# Patient Record
Sex: Female | Born: 1935 | Race: White | Hispanic: No | Smoking: Never smoker
Health system: Southern US, Community
[De-identification: ages and names within clinical notes are randomized; demographics above are authoritative.]

## PROBLEM LIST (undated history)

## (undated) DIAGNOSIS — C801 Malignant (primary) neoplasm, unspecified: Secondary | ICD-10-CM

## (undated) DIAGNOSIS — N183 Chronic kidney disease, stage 3 unspecified: Secondary | ICD-10-CM

## (undated) DIAGNOSIS — F0280 Dementia in other diseases classified elsewhere without behavioral disturbance: Secondary | ICD-10-CM

## (undated) DIAGNOSIS — I609 Nontraumatic subarachnoid hemorrhage, unspecified: Secondary | ICD-10-CM

## (undated) DIAGNOSIS — I10 Essential (primary) hypertension: Secondary | ICD-10-CM

## (undated) DIAGNOSIS — E785 Hyperlipidemia, unspecified: Secondary | ICD-10-CM

## (undated) DIAGNOSIS — T148XXA Other injury of unspecified body region, initial encounter: Secondary | ICD-10-CM

## (undated) DIAGNOSIS — G309 Alzheimer's disease, unspecified: Secondary | ICD-10-CM

## (undated) DIAGNOSIS — E119 Type 2 diabetes mellitus without complications: Secondary | ICD-10-CM

## (undated) DIAGNOSIS — S066X9A Traumatic subarachnoid hemorrhage with loss of consciousness of unspecified duration, initial encounter: Secondary | ICD-10-CM

## (undated) DIAGNOSIS — R4020 Unspecified coma: Secondary | ICD-10-CM

## (undated) HISTORY — PX: ABDOMINAL HYSTERECTOMY: SHX81

## (undated) HISTORY — PX: MASTECTOMY PARTIAL / LUMPECTOMY: SUR851

## (undated) HISTORY — PX: EYE SURGERY: SHX253

---

## 2004-03-03 ENCOUNTER — Ambulatory Visit: Payer: Self-pay | Admitting: Radiation Oncology

## 2004-06-29 ENCOUNTER — Ambulatory Visit: Payer: Self-pay | Admitting: Internal Medicine

## 2004-09-19 ENCOUNTER — Ambulatory Visit: Payer: Self-pay | Admitting: Internal Medicine

## 2004-12-26 ENCOUNTER — Ambulatory Visit: Payer: Self-pay | Admitting: Internal Medicine

## 2004-12-27 ENCOUNTER — Ambulatory Visit: Payer: Self-pay | Admitting: Internal Medicine

## 2005-03-02 ENCOUNTER — Ambulatory Visit: Payer: Self-pay | Admitting: Radiation Oncology

## 2005-06-26 ENCOUNTER — Ambulatory Visit: Payer: Self-pay | Admitting: Internal Medicine

## 2005-06-29 ENCOUNTER — Ambulatory Visit: Payer: Self-pay | Admitting: Internal Medicine

## 2005-09-25 ENCOUNTER — Ambulatory Visit: Payer: Self-pay | Admitting: Internal Medicine

## 2006-03-01 ENCOUNTER — Ambulatory Visit: Payer: Self-pay | Admitting: Radiation Oncology

## 2006-05-22 ENCOUNTER — Inpatient Hospital Stay: Payer: Self-pay | Admitting: Internal Medicine

## 2006-07-05 ENCOUNTER — Ambulatory Visit: Payer: Self-pay | Admitting: Internal Medicine

## 2006-07-28 ENCOUNTER — Ambulatory Visit: Payer: Self-pay | Admitting: Internal Medicine

## 2006-07-31 IMAGING — CR DG CHEST 2V
1 series · 2 of 2 positions shown · non-contrast
Comparison: none

REASON FOR EXAM: UX CA BREAST
COMMENTS:

[Series 3518: postero_anterior · 0.11mm/px · 2 of 2 slices shown]
[im 1/2]
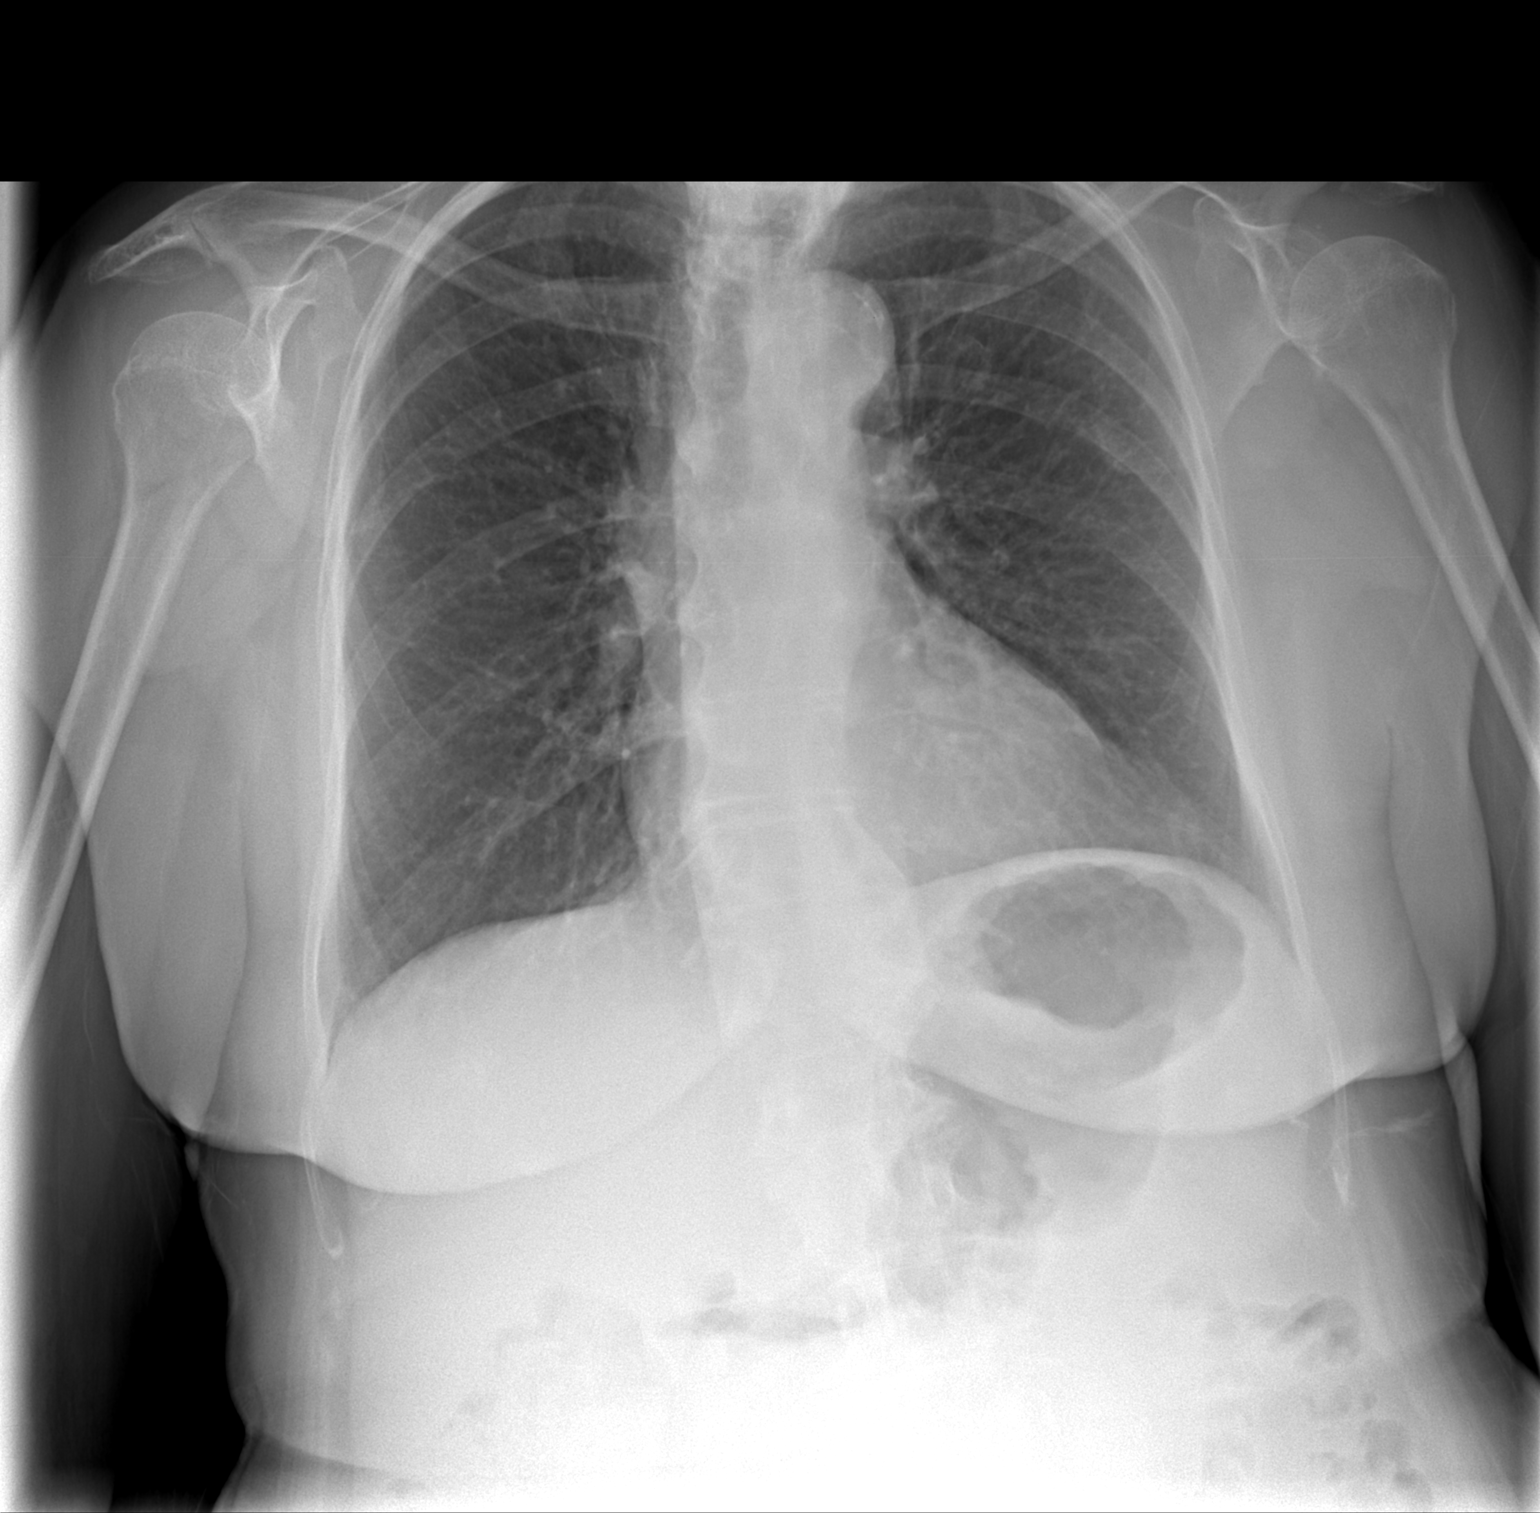
[im 2/2]
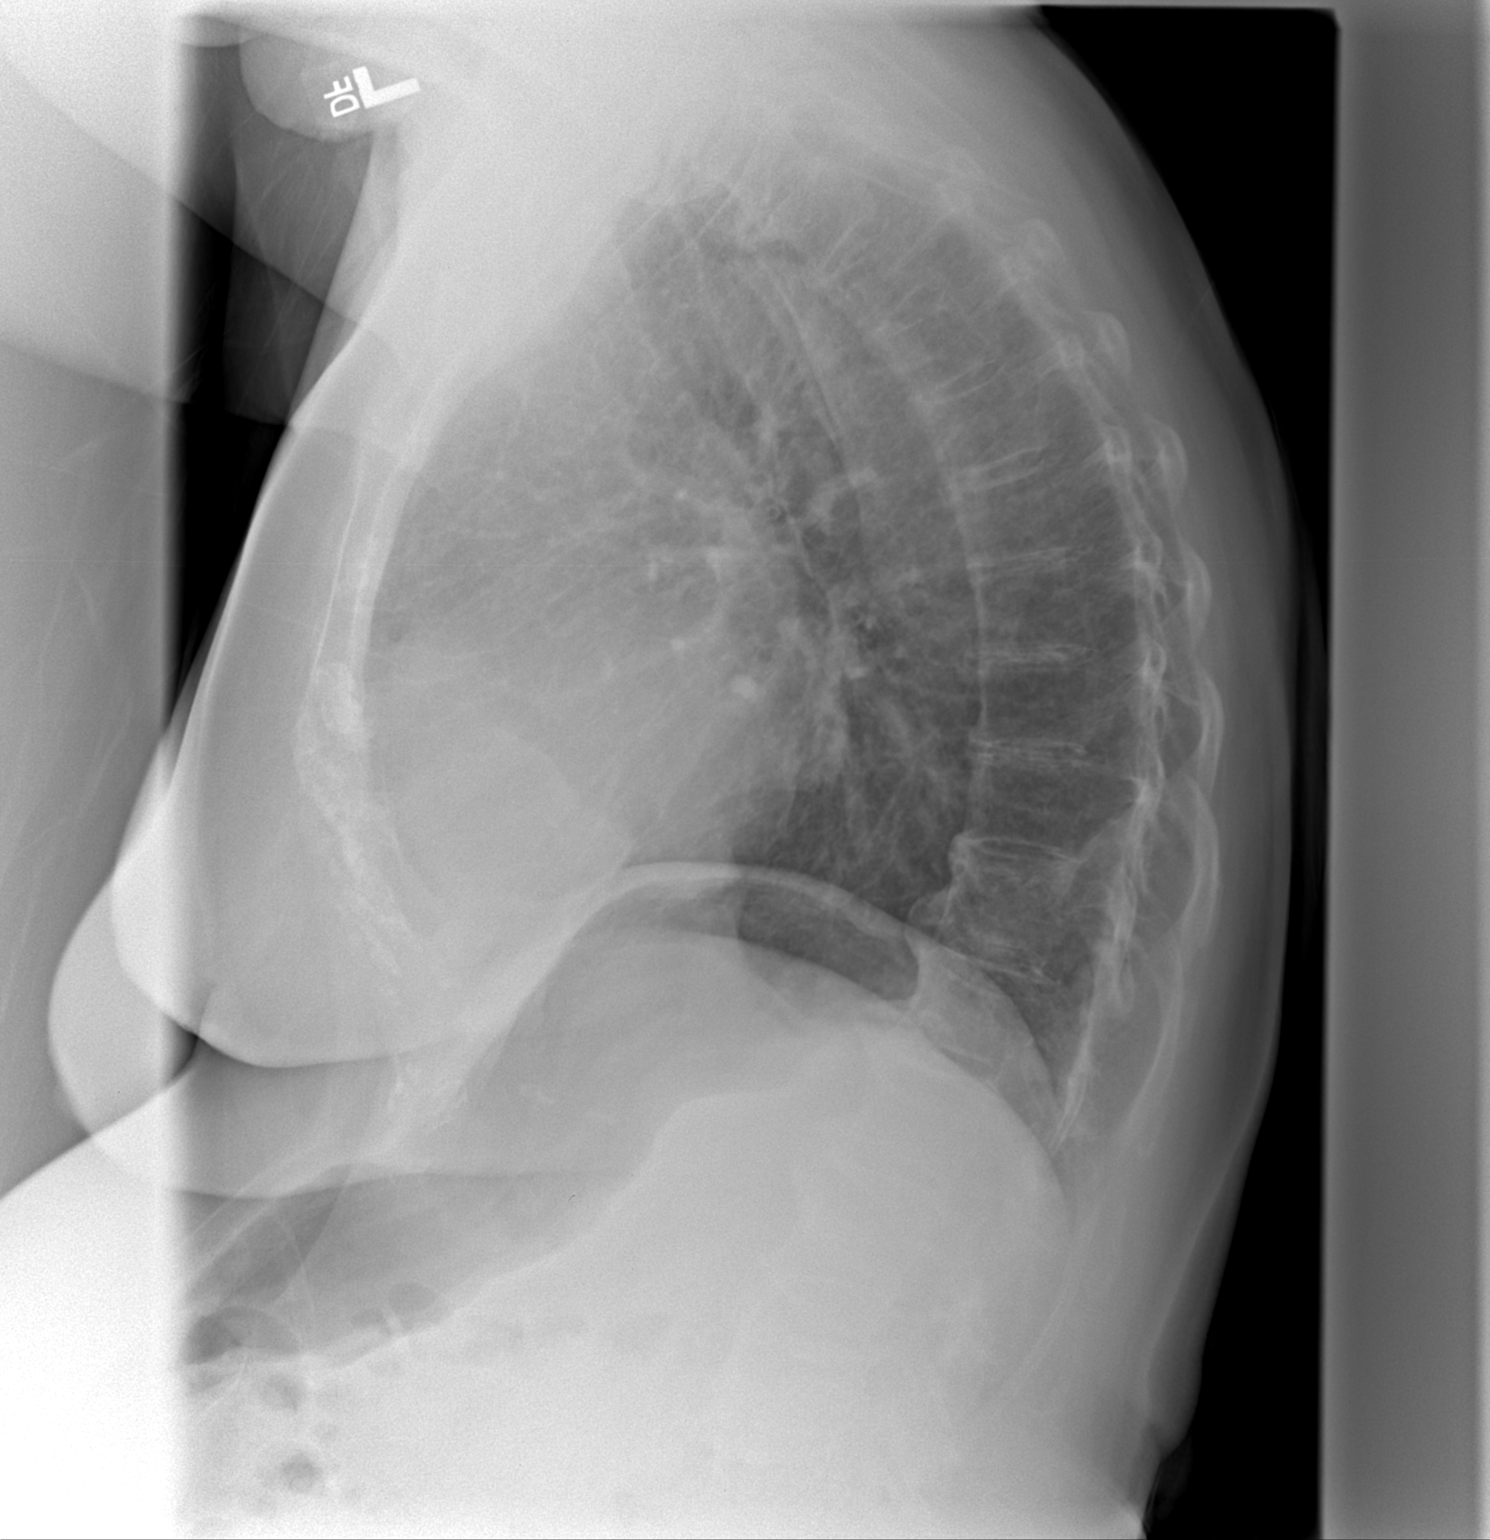

[2 of 2 positions shown; findings below may reference images not displayed]

PROCEDURE:     DXR - DXR CHEST PA (OR AP) AND LATERAL  - June 26, 2005  [DATE]

RESULT:       Comparison is made to study 26 December, 2004.

The lungs are adequately inflated.  There is no focal infiltrate. The heart
is not enlarged.  There is gentle curvature of the thoracolumbar spine.  I
see no compression fracture.  Calcification of the anterior longitudinal
ligament is seen.  There is tortuosity of the descending thoracic aorta.
IMPRESSION: 1.     I see no evidence of congestive heart failure nor of pneumonia.
2.     I do not see findings suspicious for metastatic disease in the chest.

## 2006-10-01 ENCOUNTER — Ambulatory Visit: Payer: Self-pay | Admitting: Internal Medicine

## 2006-12-28 ENCOUNTER — Ambulatory Visit: Payer: Self-pay | Admitting: Internal Medicine

## 2007-01-03 ENCOUNTER — Ambulatory Visit: Payer: Self-pay | Admitting: Internal Medicine

## 2007-01-28 ENCOUNTER — Ambulatory Visit: Payer: Self-pay | Admitting: Internal Medicine

## 2007-07-11 ENCOUNTER — Ambulatory Visit: Payer: Self-pay | Admitting: Internal Medicine

## 2007-07-28 ENCOUNTER — Ambulatory Visit: Payer: Self-pay | Admitting: Internal Medicine

## 2007-08-28 ENCOUNTER — Ambulatory Visit: Payer: Self-pay | Admitting: Internal Medicine

## 2007-09-05 ENCOUNTER — Ambulatory Visit: Payer: Self-pay | Admitting: Internal Medicine

## 2007-09-27 ENCOUNTER — Ambulatory Visit: Payer: Self-pay | Admitting: Internal Medicine

## 2007-10-02 ENCOUNTER — Ambulatory Visit: Payer: Self-pay | Admitting: Internal Medicine

## 2008-02-09 ENCOUNTER — Emergency Department: Payer: Self-pay | Admitting: Emergency Medicine

## 2008-02-27 ENCOUNTER — Ambulatory Visit: Payer: Self-pay | Admitting: Internal Medicine

## 2008-03-03 ENCOUNTER — Ambulatory Visit: Payer: Self-pay | Admitting: Internal Medicine

## 2008-03-29 ENCOUNTER — Ambulatory Visit: Payer: Self-pay | Admitting: Internal Medicine

## 2008-05-21 ENCOUNTER — Emergency Department: Payer: Self-pay | Admitting: Emergency Medicine

## 2008-08-27 ENCOUNTER — Ambulatory Visit: Payer: Self-pay | Admitting: Internal Medicine

## 2008-09-01 ENCOUNTER — Ambulatory Visit: Payer: Self-pay | Admitting: Internal Medicine

## 2008-09-26 ENCOUNTER — Ambulatory Visit: Payer: Self-pay | Admitting: Internal Medicine

## 2008-10-27 ENCOUNTER — Ambulatory Visit: Payer: Self-pay | Admitting: Internal Medicine

## 2009-02-26 ENCOUNTER — Ambulatory Visit: Payer: Self-pay | Admitting: Internal Medicine

## 2009-03-02 ENCOUNTER — Ambulatory Visit: Payer: Self-pay | Admitting: Internal Medicine

## 2009-03-29 ENCOUNTER — Ambulatory Visit: Payer: Self-pay | Admitting: Internal Medicine

## 2009-10-14 ENCOUNTER — Ambulatory Visit: Payer: Self-pay | Admitting: Internal Medicine

## 2010-12-01 ENCOUNTER — Ambulatory Visit: Payer: Self-pay | Admitting: Internal Medicine

## 2011-12-25 ENCOUNTER — Ambulatory Visit: Payer: Self-pay | Admitting: Internal Medicine

## 2013-05-25 ENCOUNTER — Inpatient Hospital Stay: Payer: Self-pay | Admitting: Internal Medicine

## 2013-05-25 LAB — COMPREHENSIVE METABOLIC PANEL
Alkaline Phosphatase: 93 U/L
Anion Gap: 11 (ref 7–16)
BUN: 23 mg/dL — ABNORMAL HIGH (ref 7–18)
Bilirubin,Total: 0.3 mg/dL (ref 0.2–1.0)
Calcium, Total: 10.1 mg/dL (ref 8.5–10.1)
Chloride: 99 mmol/L (ref 98–107)
Co2: 24 mmol/L (ref 21–32)
Creatinine: 1.35 mg/dL — ABNORMAL HIGH (ref 0.60–1.30)
Potassium: 3.8 mmol/L (ref 3.5–5.1)
Sodium: 134 mmol/L — ABNORMAL LOW (ref 136–145)

## 2013-05-25 LAB — URINALYSIS, COMPLETE
Bilirubin,UR: NEGATIVE
Glucose,UR: 50 mg/dL (ref 0–75)
Ketone: NEGATIVE
Nitrite: NEGATIVE
Ph: 5 (ref 4.5–8.0)
Protein: 30
Squamous Epithelial: 1
WBC UR: 253 /HPF (ref 0–5)

## 2013-05-25 LAB — CBC
HCT: 42.7 % (ref 35.0–47.0)
HGB: 14.3 g/dL (ref 12.0–16.0)
MCH: 30.7 pg (ref 26.0–34.0)
MCV: 92 fL (ref 80–100)
RBC: 4.65 10*6/uL (ref 3.80–5.20)
RDW: 14.5 % (ref 11.5–14.5)

## 2013-05-26 LAB — BASIC METABOLIC PANEL
Anion Gap: 5 — ABNORMAL LOW (ref 7–16)
BUN: 25 mg/dL — ABNORMAL HIGH (ref 7–18)
Calcium, Total: 9.3 mg/dL (ref 8.5–10.1)
Chloride: 104 mmol/L (ref 98–107)
Creatinine: 1.45 mg/dL — ABNORMAL HIGH (ref 0.60–1.30)
EGFR (African American): 36 — ABNORMAL LOW
Osmolality: 274 (ref 275–301)
Sodium: 135 mmol/L — ABNORMAL LOW (ref 136–145)

## 2013-05-26 LAB — CBC WITH DIFFERENTIAL/PLATELET
Basophil %: 0.9 %
Eosinophil #: 0 10*3/uL (ref 0.0–0.7)
Eosinophil %: 0.1 %
HCT: 37 % (ref 35.0–47.0)
HGB: 12.6 g/dL (ref 12.0–16.0)
Lymphocyte #: 0.6 10*3/uL — ABNORMAL LOW (ref 1.0–3.6)
Lymphocyte %: 9.6 %
MCH: 31.3 pg (ref 26.0–34.0)
Monocyte #: 0.6 x10 3/mm (ref 0.2–0.9)
Monocyte %: 9.2 %
Neutrophil #: 4.8 10*3/uL (ref 1.4–6.5)
Neutrophil %: 80.2 %
Platelet: 170 10*3/uL (ref 150–440)
RBC: 4.03 10*6/uL (ref 3.80–5.20)
RDW: 14.5 % (ref 11.5–14.5)

## 2013-05-26 LAB — MAGNESIUM: Magnesium: 1.4 mg/dL — ABNORMAL LOW

## 2013-05-27 LAB — CBC WITH DIFFERENTIAL/PLATELET
Basophil #: 0 10*3/uL (ref 0.0–0.1)
Basophil %: 0.9 %
Eosinophil #: 0 10*3/uL (ref 0.0–0.7)
Eosinophil %: 0.8 %
HGB: 12.9 g/dL (ref 12.0–16.0)
Lymphocyte %: 24.4 %
MCH: 30.7 pg (ref 26.0–34.0)
MCHC: 33.4 g/dL (ref 32.0–36.0)
MCV: 92 fL (ref 80–100)
Neutrophil %: 64.2 %
Platelet: 146 10*3/uL — ABNORMAL LOW (ref 150–440)
RBC: 4.19 10*6/uL (ref 3.80–5.20)
RDW: 14.3 % (ref 11.5–14.5)
WBC: 4 10*3/uL (ref 3.6–11.0)

## 2013-05-27 LAB — BASIC METABOLIC PANEL
Anion Gap: 9 (ref 7–16)
BUN: 20 mg/dL — ABNORMAL HIGH (ref 7–18)
Calcium, Total: 8.2 mg/dL — ABNORMAL LOW (ref 8.5–10.1)
Chloride: 108 mmol/L — ABNORMAL HIGH (ref 98–107)
Co2: 22 mmol/L (ref 21–32)
EGFR (Non-African Amer.): 41 — ABNORMAL LOW
Glucose: 148 mg/dL — ABNORMAL HIGH (ref 65–99)
Osmolality: 283 (ref 275–301)
Potassium: 4 mmol/L (ref 3.5–5.1)
Sodium: 139 mmol/L (ref 136–145)

## 2013-05-27 LAB — URINE CULTURE

## 2013-05-28 LAB — CBC WITH DIFFERENTIAL/PLATELET
Basophil %: 0.6 %
Eosinophil #: 0.1 10*3/uL (ref 0.0–0.7)
HCT: 41.3 % (ref 35.0–47.0)
Lymphocyte #: 1.3 10*3/uL (ref 1.0–3.6)
Lymphocyte %: 34.4 %
MCHC: 33.5 g/dL (ref 32.0–36.0)
MCV: 93 fL (ref 80–100)
Monocyte #: 0.3 x10 3/mm (ref 0.2–0.9)
Monocyte %: 9 %
Neutrophil #: 2 10*3/uL (ref 1.4–6.5)
RDW: 14.4 % (ref 11.5–14.5)
WBC: 3.7 10*3/uL (ref 3.6–11.0)

## 2013-05-28 LAB — BASIC METABOLIC PANEL
Anion Gap: 8 (ref 7–16)
BUN: 19 mg/dL — ABNORMAL HIGH (ref 7–18)
Co2: 23 mmol/L (ref 21–32)
Creatinine: 1.14 mg/dL (ref 0.60–1.30)
Glucose: 165 mg/dL — ABNORMAL HIGH (ref 65–99)
Osmolality: 283 (ref 275–301)
Potassium: 4.1 mmol/L (ref 3.5–5.1)
Sodium: 139 mmol/L (ref 136–145)

## 2013-05-30 LAB — CULTURE, BLOOD (SINGLE)

## 2013-05-30 LAB — BASIC METABOLIC PANEL
Anion Gap: 8 (ref 7–16)
BUN: 22 mg/dL — AB (ref 7–18)
CHLORIDE: 106 mmol/L (ref 98–107)
Calcium, Total: 8.8 mg/dL (ref 8.5–10.1)
Co2: 22 mmol/L (ref 21–32)
Creatinine: 1.03 mg/dL (ref 0.60–1.30)
EGFR (African American): 54 — ABNORMAL LOW
GFR CALC NON AF AMER: 46 — AB
Glucose: 139 mg/dL — ABNORMAL HIGH (ref 65–99)
Osmolality: 278 (ref 275–301)
Potassium: 3.9 mmol/L (ref 3.5–5.1)
SODIUM: 136 mmol/L (ref 136–145)

## 2013-05-30 LAB — MAGNESIUM: Magnesium: 2.1 mg/dL

## 2013-08-24 ENCOUNTER — Inpatient Hospital Stay: Payer: Self-pay | Admitting: Internal Medicine

## 2013-08-24 LAB — URINALYSIS, COMPLETE
Bilirubin,UR: NEGATIVE
Blood: NEGATIVE
Glucose,UR: >=500 mg/dL (ref 0–75)
Hyaline Cast: 2
Ketone: NEGATIVE
Nitrite: NEGATIVE
PH: 6 (ref 4.5–8.0)
Protein: NEGATIVE
RBC,UR: 1 /HPF (ref 0–5)
Specific Gravity: 1.01 (ref 1.003–1.030)

## 2013-08-24 LAB — PROTIME-INR
INR: 1
Prothrombin Time: 13.2 secs (ref 11.5–14.7)

## 2013-08-24 LAB — COMPREHENSIVE METABOLIC PANEL
ALBUMIN: 3.2 g/dL — AB (ref 3.4–5.0)
ALK PHOS: 93 U/L
AST: 26 U/L (ref 15–37)
Anion Gap: 6 — ABNORMAL LOW (ref 7–16)
BUN: 24 mg/dL — AB (ref 7–18)
Bilirubin,Total: 0.3 mg/dL (ref 0.2–1.0)
CO2: 28 mmol/L (ref 21–32)
Calcium, Total: 9.3 mg/dL (ref 8.5–10.1)
Chloride: 98 mmol/L (ref 98–107)
Creatinine: 1.45 mg/dL — ABNORMAL HIGH (ref 0.60–1.30)
EGFR (Non-African Amer.): 31 — ABNORMAL LOW
GFR CALC AF AMER: 36 — AB
GLUCOSE: 303 mg/dL — AB (ref 65–99)
Osmolality: 280 (ref 275–301)
Potassium: 4.2 mmol/L (ref 3.5–5.1)
SGPT (ALT): 23 U/L (ref 12–78)
SODIUM: 132 mmol/L — AB (ref 136–145)
Total Protein: 7.1 g/dL (ref 6.4–8.2)

## 2013-08-24 LAB — CBC WITH DIFFERENTIAL/PLATELET
Basophil #: 0.1 10*3/uL (ref 0.0–0.1)
Basophil %: 0.7 %
EOS ABS: 0 10*3/uL (ref 0.0–0.7)
Eosinophil %: 0.1 %
HCT: 40.9 % (ref 35.0–47.0)
HGB: 13.2 g/dL (ref 12.0–16.0)
Lymphocyte #: 0.9 10*3/uL — ABNORMAL LOW (ref 1.0–3.6)
Lymphocyte %: 6.7 %
MCH: 30.2 pg (ref 26.0–34.0)
MCHC: 32.3 g/dL (ref 32.0–36.0)
MCV: 94 fL (ref 80–100)
Monocyte #: 0.5 x10 3/mm (ref 0.2–0.9)
Monocyte %: 3.8 %
NEUTROS ABS: 12 10*3/uL — AB (ref 1.4–6.5)
Neutrophil %: 88.7 %
Platelet: 213 10*3/uL (ref 150–440)
RBC: 4.38 10*6/uL (ref 3.80–5.20)
RDW: 14.5 % (ref 11.5–14.5)
WBC: 13.6 10*3/uL — ABNORMAL HIGH (ref 3.6–11.0)

## 2013-08-25 LAB — BASIC METABOLIC PANEL
ANION GAP: 7 (ref 7–16)
BUN: 21 mg/dL — ABNORMAL HIGH (ref 7–18)
CO2: 24 mmol/L (ref 21–32)
Calcium, Total: 8.4 mg/dL — ABNORMAL LOW (ref 8.5–10.1)
Chloride: 106 mmol/L (ref 98–107)
Creatinine: 1.38 mg/dL — ABNORMAL HIGH (ref 0.60–1.30)
EGFR (African American): 38 — ABNORMAL LOW
EGFR (Non-African Amer.): 33 — ABNORMAL LOW
Glucose: 118 mg/dL — ABNORMAL HIGH (ref 65–99)
OSMOLALITY: 278 (ref 275–301)
POTASSIUM: 4 mmol/L (ref 3.5–5.1)
SODIUM: 137 mmol/L (ref 136–145)

## 2013-08-25 LAB — CBC WITH DIFFERENTIAL/PLATELET
Basophil #: 0.1 10*3/uL (ref 0.0–0.1)
Basophil %: 0.7 %
EOS ABS: 0.2 10*3/uL (ref 0.0–0.7)
Eosinophil %: 1.6 %
HCT: 35 % (ref 35.0–47.0)
HGB: 11.8 g/dL — AB (ref 12.0–16.0)
LYMPHS PCT: 25.2 %
Lymphocyte #: 2.5 10*3/uL (ref 1.0–3.6)
MCH: 31.4 pg (ref 26.0–34.0)
MCHC: 33.8 g/dL (ref 32.0–36.0)
MCV: 93 fL (ref 80–100)
Monocyte #: 0.8 x10 3/mm (ref 0.2–0.9)
Monocyte %: 8 %
Neutrophil #: 6.5 10*3/uL (ref 1.4–6.5)
Neutrophil %: 64.5 %
PLATELETS: 212 10*3/uL (ref 150–440)
RBC: 3.77 10*6/uL — ABNORMAL LOW (ref 3.80–5.20)
RDW: 14.6 % — ABNORMAL HIGH (ref 11.5–14.5)
WBC: 10.1 10*3/uL (ref 3.6–11.0)

## 2013-08-26 LAB — URINE CULTURE

## 2014-09-18 NOTE — H&P (Signed)
PATIENT NAME:  Cynthia Lynch, Cynthia Lynch MR#:  [id number] DATE OF BIRTH:  1916-10-02  DATE OF ADMISSION:  10/26/2012  PRIMARY CARE PHYSICIAN:  Rusty Aus, MD  REFERRING PHYSICIAN:  Briant Sites. Joni Fears, MD  CHIEF COMPLAINT:  Weakness for 2 days.   HISTORY OF PRESENT ILLNESS: A 81 year old Caucasian female with a history of hypertension, diabetes, dementia, was sent to ED due to weakness and confusion for the past 2 days. The patient is disoriented, unable to provide any information. According to the patient's daughters, the patient was fine until 2 days ago. She was noticed to have generalized weakness, unable to stand or walk. In addition, the patient has decreased mental status, disorientated, so the patient was sent to the ED for further evaluation. The patient was noted to have a UTI and also the patient has a poor oral intake for the past 2 days.   PAST MEDICAL HISTORY: Hypertension, diabetes, dementia, breast cancer, ovary cancer.   PAST SURGICAL HISTORY: Right-sided lumpectomy, hysterectomy, bilateral oophorectomy, bilateral cataract surgery.   SOCIAL HISTORY: No smoking, alcohol drinking or illicit drugs.   FAMILY HISTORY: Positive for diabetes.   REVIEW OF SYSTEMS: Unable to obtain due to patient's mental status.   ALLERGIES: BEE STING.   HOME MEDICATIONS:  1.  Tylenol 325 mg p.o. p.r.n.  2.  Sudafed PE 10 mg p.o. once a day.  3.  Premarin vaginal cream 0.625 mg topical daily.  4. HCTZ-lisinopril 12.5-10 mg p.o. daily.  5.  Glimepiride 1 mg p.o. 0.5 tablets once a day.  6.  Citracal plus D 630 mg p.o. b.i.d.  7.  Benadryl 25 mg p.o. 1 tablet p.r.n.  8.  Aspirin 81 mg p.o. daily.  9.  Arimidex tablets 1 tablet p.o. daily.  10.  Alendronate 70 mg p.o. once a week.   PHYSICAL EXAMINATION: VITAL SIGNS: Temperature 100.5, blood pressure 124/55, pulse 108, respirations 20, O2  saturation 95% on room air.  GENERAL: The patient is disoriented,  did not follow commands, in no acute  distress.  HEENT: Unable to examine because the patient is not cooperative. NECK: Supple. No JVD or carotid bruits. No lymphadenopathy.  CARDIOVASCULAR: S1, S2 regular rate and rhythm. No murmurs, gallops.  PULMONARY: Bilateral air entry. Bilateral wheezing and crackles. No use of accessory muscle to breathe.  ABDOMEN: Soft. No distention. No tenderness. No organomegaly. Bowel sounds present.  EXTREMITIES: No edema, clubbing or cyanosis. No calf tenderness. Bilateral pedal pulses present.  SKIN: No rash or jaundice.  NEUROLOGY:  Unable to examine due to the patient's mental status.   LABORATORY AND RADIOLOGICAL DATA:   1.  Urinalysis shows WBC 253, RBCs 10 and nitrite negative.  2.  Chest x-ray did not show any edema or consolidation.  3.  Lactic acid 3.9, glucose 238, BUN 23, creatinine 1.35, sodium 134, potassium 3.8, chloride 99, bicarb 24.  4.  CBC in normal range.   IMPRESSIONS: 1.  Acute encephalopathy.  2.  Urinary tract infection.  3.  Questionable pneumonia.  4.  Acute renal failure.  5.  Hypertension.  6.  Diabetes.  7.  Dementia.   PLAN OF TREATMENT: 1.  The patient will be admitted to medical floor. We will start Levaquin. Follow up blood culture, urine culture, CBC.  2.  For acute renal failure, we will start on normal saline. Follow up BMP.  3.  We will get a physical therapy evaluation.  4.  For diabetes, we will start a sliding scale but hold  glyburide.   I discussed the patient's condition and plan of treatment with the patient's 2 daughters.  They cannot decide CODE STATUS now, we will place for full code now.   TIME SPENT: About 53 minutes.    ____________________________ Demetrios Loll, MD qc:cs D: 08/13/2012 19:26:00 ET T: 08/13/2012 20:41:05 ET JOB#: [id number]  cc: Demetrios Loll, MD, <Dictator> Demetrios Loll MD ELECTRONICALLY SIGNED 05/26/2013 17:03

## 2014-09-19 NOTE — Discharge Summary (Signed)
PATIENT NAME:  Cynthia Lynch, Cynthia Lynch MR#:  [id number] DATE OF BIRTH:  11/01/1916  DATE OF ADMISSION:  12-21-2012 DATE OF DISCHARGE:  05/30/2013  DISCHARGE DIAGNOSES: 1.  Encephalopathy due to urinary tract infection.  2.  Asthmatic bronchitis.  3.  Hypokalemia/hypomagnesemia.  4.  Acute renal failure, resolved, secondary to volume depletion.  5.  Hypertension.  6.  Dementia, moderate, Alzheimer type.  7.  Diabetes mellitus, non-insulin-requiring.     DISCHARGE MEDICATIONS:   1.  Lisinopril HCT10/12.5 mg daily.  2.  Citracal 600 mg b.i.d. 3.  Allegra 60 mg b.i.d. 4. Glimepiride 2 mg b.i.d.  5.  Aspirin 81 mg daily.  6.  Premarin cream, apply small amount 3 times weekly to the urethral opening.  7.  Prednisone 10 mg daily x 7 days.  8.  Robitussin p.r.n. cough.  9.  Senna 1 tab daily.  10.  Magnesium oxide 400 mg daily.  11.  Ceftin 250 mg b.i.d. x 7 days.   REASON FOR ADMISSION: A 81 year old female presents with altered mental status, UTI and acute renal failure. Please see H and P for HPI, past medical history and physical exam.   HOSPITAL COURSE: The patient was admitted, treated with IV Rocephin due to resistant E. coli. She was switched over to Ceftin. She did have a flare of her bronchitis with some asthma and received steroids for a couple of days; that is all resolved. Her potassium and magnesium were replaced. She was hydrated and renal function came back to normal. Overall prognosis is guarded.    ____________________________ Rusty Aus, MD mfm:dmm D: 05/30/2013 08:11:56 ET T: 05/30/2013 09:42:54 ET JOB#: [id number]  cc: Rusty Aus, MD, <Dictator> Rusty Aus MD ELECTRONICALLY SIGNED 06/02/2013 8:18

## 2014-09-19 NOTE — Discharge Summary (Signed)
PATIENT NAME:  Cynthia Lynch, Cynthia Lynch MR#:  [id number] DATE OF BIRTH:  Oct 19, 1916  DATE OF ADMISSION:  08/24/2013 DATE OF DISCHARGE:  08/26/2013  DISCHARGE DIAGNOSES: 1. Subarachnoid bleed post head trauma with right frontal scalp hematoma.  2. Alzheimer's dementia.  3. Chronic renal insufficiency.  4. Peripheral vascular disease.  5. Carotid stenosis.  6. Aortic murmur.  7. Diabetes mellitus, non-insulin-requiring.   DISCHARGE MEDICATIONS: Macrobid 100 mg daily, fexofenadine 60 mg b.i.d., glimepiride 2 mg b.i.d.  Premarin vaginal cream daily, Bactroban cream to the area daily, magnesium 400 mg daily, senna 8.6 mg daily. HCTZ 12.5 mg daily, nystatin cream p.r.n., Citracal D b.i.d.   REASON FOR ADMISSION: A 81 year old female presents with a head trauma and subarachnoid bleed. Please see H and P for HPI, past medical history, and physical exam.   HOSPITAL COURSE: The patient was admitted. Brain CT showed a limited frontal subarachnoid bleed that was unchanged on repeat CT. Frontal hematoma on her scalp was stable as well. No sign of cellulitis. Mentally she remained stable. She had really no altered mental status. No worsened confusion and she was treated conservatively. Aspirin was discontinued. Blood pressure medicine was discontinued as her pressure systolic were running around 95 to 100. Overall prognosis is guarded with her multiple medical problems and moderate to severe dementia. She will be going back to [facility] this afternoon.  ____________________________ Rusty Aus, MD mfm:sg D: 08/26/2013 07:34:14 ET T: 08/26/2013 08:00:04 ET JOB#: [id number]  cc: Rusty Aus, MD, <Dictator> [place] MD ELECTRONICALLY SIGNED 08/26/2013 8:17

## 2014-09-19 NOTE — H&P (Signed)
PATIENT NAME:  Cynthia Lynch, Cynthia Lynch MR#:  [id number] DATE OF BIRTH:  Jul 02, 1916  DATE OF ADMISSION:  08/24/2013  REFERRING PHYSICIAN: Dr. Jasmine December.   FAMILY PHYSICIAN: Dr. Emily Filbert.   REASON FOR ADMISSION: Head trauma with subarachnoid hemorrhage.   HISTORY OF PRESENT ILLNESS: The patient is a 81 year old female who resides at [facility] with a significant history of Alzheimer's dementia, benign hypertension, as well as recurrent UTIs with chronic renal failure. The patient also has a history of breast and ovarian cancer. She is chronically confused and demented. She apparently fell out of bed yesterday or last night and was found by the caretakers at the assisted living facility on the floor with a large amount of dried blood to the face, head, arms, and hands. She was brought to the Emergency Room where she was noted to have a large hematoma above the right eye. CT showed subarachnoid hemorrhage. She is a no code and the family does not want her transported to a larger medical center. The ER physician did speak with neurosurgery who said they would not do anything in this case anyway. She is now admitted here for further treatment and evaluation. The patient currently denies pain. She is alert, but disoriented to place or time.   PAST MEDICAL HISTORY: 1. Alzheimer's dementia.  2. History of pyelonephritis with recurrent urinary tract infections.  3. Chronic renal failure.  4. Benign hypertension.  5. Peripheral vascular disease.  6. History of breast cancer status post left lumpectomy.  7. History of ovarian cancer, status post total abdominal hysterectomy with bilateral salpingo-oophorectomy.  8. Hyperlipidemia.  9. Right carotid stenosis.  10. Aortic murmur.  11. Allergic rhinitis.  12. Type 2 diabetes.   MEDICATIONS: 1. Lisinopril hydrochlorothiazide 10/12.5, 1 p.o. daily.  2. Macrobid 100 mg p.o. daily.  3. Fexofenadine 60 mg p.o. b.i.d.  4. Glimepiride 2 mg p.o.  b.i.d.  5. Aspirin 81 mg p.o. daily.  6. Premarin vaginal cream once daily to urethral opening.   ALLERGIES: No known drug allergies.   SOCIAL HISTORY: Negative for alcohol or tobacco abuse.   FAMILY HISTORY: Noncontributory.   REVIEW OF SYSTEMS: Unable to obtain from the patient due to her confusion.   PHYSICAL EXAMINATION: GENERAL: The patient is in no acute distress.  VITAL SIGNS: Currently remarkable for a blood pressure of 124/66, heart rate of 80, respiratory rate 18, temperature of 97.6, sat 100% on room air.  HEENT: Normocephalic with a large hematoma to the right forehead with dried blood and laceration noted. Pupils equally round and reactive to light and accommodation. _____ intact. Sclerae are anicteric. Conjunctivae are clear.  OROPHARYNX: Clear.  NECK: Supple without JVD. No adenopathy or thyromegaly is noted.  LUNGS: Clear to auscultation and percussion without wheezes, rales or rhonchi. No dullness. Respiratory effort is normal.  CARDIAC: Regular rate and rhythm. Normal S1, S2. There is a 2/6 systolic murmur noted at the upper sternal border. No rubs or gallops are present. PMI is nondisplaced. Chest wall is nontender.  ABDOMEN: Soft, nontender, with normoactive bowel sounds. No organomegaly or masses were appreciated. No hernias or bruits were noted.  EXTREMITIES: Reveal trace edema with stasis changes. Pulses were 2+ bilaterally.  SKIN: Warm and dry without rash.  NEUROLOGIC: Cranial nerves II through XII grossly intact. Deep tendon reflexes were symmetric. Motor and sensory examination is nonfocal.  PSYCHIATRIC: Could not be performed.   LABORATORY DATA: EKG revealed sinus rhythm with no acute ischemic changes. Head CT showed a  large right forehead scalp hematoma with a small amount of subarachnoid blood in the frontal regions bilaterally. CT of the cervical spine showed degenerative changes only.   Her white count was 13.6 with a hemoglobin of 13.2. Glucose 303 with a  BUN of 24, creatinine 1.45, sodium of 132 and a GFR of 31.   ASSESSMENT: 1. Subarachnoid hemorrhage due to trauma.  2. Right forehead hematoma.  3. Stage III chronic kidney disease.  4. Type 2 diabetes.  5. Benign hypertension.  6. Alzheimer dementia.   PLAN: The patient will be admitted to the floor as a NO CODE BLUE, DO NOT RESUSCITATE. Neuro checks and vital signs q.4h. We will send off a urine culture. We will follow her sugars closely. We will hold her diuretic and begin IV fluids. We will start IV antibiotics. Follow-up head CT in 24 hours. We will consult the social worker and physical therapy. Soft diet for now. Further treatment and evaluation will depend upon the patient's progress.   TOTAL TIME SPENT THIS PATIENT: 50 minutes.    ____________________________ Leonie Douglas Doy Hutching, MD jds:sg D: 08/24/2013 09:16:33 ET T: 08/24/2013 10:26:36 ET JOB#: [id number]  cc: Leonie Douglas. Doy Hutching, MD, <Dictator> Rusty Aus, MD  Ariannie Penaloza D Alan Drummer MD ELECTRONICALLY SIGNED 08/25/2013 12:01

## 2014-09-19 NOTE — Discharge Summary (Signed)
PATIENT NAME:  Cynthia Lynch, BUCKELS MR#:  [id number] DATE OF BIRTH:  [birth year]  DATE OF ADMISSION:  08/24/2013 DATE OF DISCHARGE:  08/27/2013   Addendum  The patient held overnight because she could not urinate post Foley catheter removal, but that issue resolved, and she is doing well this morning and will be going back to [facility].   ____________________________ Rusty Aus, MD mfm:lb D: 08/27/2013 07:29:48 ET T: 08/27/2013 07:33:26 ET JOB#: [id number]  cc: Rusty Aus, MD, <Dictator> Darlean Warmoth Roselee Culver MD ELECTRONICALLY SIGNED 08/28/2013 8:08

## 2015-01-23 ENCOUNTER — Encounter: Payer: Self-pay | Admitting: Emergency Medicine

## 2015-01-23 ENCOUNTER — Emergency Department: Payer: Medicare Other

## 2015-01-23 ENCOUNTER — Inpatient Hospital Stay
Admit: 2015-01-23 | Discharge: 2015-01-23 | Disposition: A | Discharge status: Home / Self Care | Payer: Medicare Other | Attending: Internal Medicine | Admitting: Internal Medicine

## 2015-01-23 ENCOUNTER — Observation Stay
Admission: EM | Admit: 2015-01-23 | Discharge: 2015-01-25 | Payer: Medicare Other | Attending: Internal Medicine | Admitting: Internal Medicine

## 2015-01-23 DIAGNOSIS — I340 Nonrheumatic mitral (valve) insufficiency: Secondary | ICD-10-CM | POA: Diagnosis not present

## 2015-01-23 DIAGNOSIS — R918 Other nonspecific abnormal finding of lung field: Secondary | ICD-10-CM | POA: Insufficient documentation

## 2015-01-23 DIAGNOSIS — Z8249 Family history of ischemic heart disease and other diseases of the circulatory system: Secondary | ICD-10-CM | POA: Diagnosis not present

## 2015-01-23 DIAGNOSIS — E1122 Type 2 diabetes mellitus with diabetic chronic kidney disease: Secondary | ICD-10-CM | POA: Insufficient documentation

## 2015-01-23 DIAGNOSIS — Z79899 Other long term (current) drug therapy: Secondary | ICD-10-CM | POA: Insufficient documentation

## 2015-01-23 DIAGNOSIS — Z8543 Personal history of malignant neoplasm of ovary: Secondary | ICD-10-CM | POA: Insufficient documentation

## 2015-01-23 DIAGNOSIS — F0280 Dementia in other diseases classified elsewhere without behavioral disturbance: Secondary | ICD-10-CM | POA: Diagnosis not present

## 2015-01-23 DIAGNOSIS — G309 Alzheimer's disease, unspecified: Secondary | ICD-10-CM | POA: Diagnosis not present

## 2015-01-23 DIAGNOSIS — Z23 Encounter for immunization: Secondary | ICD-10-CM | POA: Insufficient documentation

## 2015-01-23 DIAGNOSIS — Z853 Personal history of malignant neoplasm of breast: Secondary | ICD-10-CM | POA: Diagnosis not present

## 2015-01-23 DIAGNOSIS — R55 Syncope and collapse: Secondary | ICD-10-CM | POA: Diagnosis present

## 2015-01-23 DIAGNOSIS — I472 Ventricular tachycardia: Secondary | ICD-10-CM | POA: Diagnosis present

## 2015-01-23 DIAGNOSIS — Z8679 Personal history of other diseases of the circulatory system: Secondary | ICD-10-CM | POA: Insufficient documentation

## 2015-01-23 DIAGNOSIS — I4721 Torsades de pointes: Secondary | ICD-10-CM

## 2015-01-23 DIAGNOSIS — I129 Hypertensive chronic kidney disease with stage 1 through stage 4 chronic kidney disease, or unspecified chronic kidney disease: Secondary | ICD-10-CM | POA: Insufficient documentation

## 2015-01-23 DIAGNOSIS — I517 Cardiomegaly: Secondary | ICD-10-CM | POA: Insufficient documentation

## 2015-01-23 DIAGNOSIS — Z66 Do not resuscitate: Secondary | ICD-10-CM | POA: Insufficient documentation

## 2015-01-23 DIAGNOSIS — I700 Atherosclerosis of aorta: Secondary | ICD-10-CM | POA: Diagnosis not present

## 2015-01-23 DIAGNOSIS — E782 Mixed hyperlipidemia: Secondary | ICD-10-CM | POA: Insufficient documentation

## 2015-01-23 DIAGNOSIS — Z9012 Acquired absence of left breast and nipple: Secondary | ICD-10-CM | POA: Insufficient documentation

## 2015-01-23 DIAGNOSIS — E860 Dehydration: Secondary | ICD-10-CM | POA: Diagnosis not present

## 2015-01-23 DIAGNOSIS — N183 Chronic kidney disease, stage 3 (moderate): Secondary | ICD-10-CM | POA: Diagnosis not present

## 2015-01-23 DIAGNOSIS — Z9889 Other specified postprocedural states: Secondary | ICD-10-CM | POA: Insufficient documentation

## 2015-01-23 DIAGNOSIS — N390 Urinary tract infection, site not specified: Secondary | ICD-10-CM | POA: Insufficient documentation

## 2015-01-23 HISTORY — DX: Malignant (primary) neoplasm, unspecified: C80.1

## 2015-01-23 HISTORY — DX: Chronic kidney disease, stage 3 (moderate): N18.3

## 2015-01-23 HISTORY — DX: Traumatic subarachnoid hemorrhage with loss of consciousness of unspecified duration, initial encounter: S06.6X9A

## 2015-01-23 HISTORY — DX: Alzheimer's disease, unspecified: G30.9

## 2015-01-23 HISTORY — DX: Type 2 diabetes mellitus without complications: E11.9

## 2015-01-23 HISTORY — DX: Other injury of unspecified body region, initial encounter: T14.8XXA

## 2015-01-23 HISTORY — DX: Dementia in other diseases classified elsewhere without behavioral disturbance: F02.80

## 2015-01-23 HISTORY — DX: Unspecified coma: R40.20

## 2015-01-23 HISTORY — DX: Chronic kidney disease, stage 3 unspecified: N18.30

## 2015-01-23 HISTORY — DX: Nontraumatic subarachnoid hemorrhage, unspecified: I60.9

## 2015-01-23 HISTORY — DX: Essential (primary) hypertension: I10

## 2015-01-23 HISTORY — DX: Hyperlipidemia, unspecified: E78.5

## 2015-01-23 LAB — CBC WITH DIFFERENTIAL/PLATELET
BASOS ABS: 0.1 10*3/uL (ref 0–0.1)
Basophils Relative: 1 %
EOS ABS: 0.1 10*3/uL (ref 0–0.7)
EOS PCT: 1 %
HCT: 40.9 % (ref 35.0–47.0)
Hemoglobin: 13.6 g/dL (ref 12.0–16.0)
Lymphocytes Relative: 17 %
Lymphs Abs: 2.1 10*3/uL (ref 1.0–3.6)
MCH: 30.4 pg (ref 26.0–34.0)
MCHC: 33.1 g/dL (ref 32.0–36.0)
MCV: 91.8 fL (ref 80.0–100.0)
Monocytes Absolute: 0.8 10*3/uL (ref 0.2–0.9)
Monocytes Relative: 6 %
Neutro Abs: 9.4 10*3/uL — ABNORMAL HIGH (ref 1.4–6.5)
Neutrophils Relative %: 75 %
PLATELETS: 171 10*3/uL (ref 150–440)
RBC: 4.46 MIL/uL (ref 3.80–5.20)
RDW: 14.1 % (ref 11.5–14.5)
WBC: 12.5 10*3/uL — AB (ref 3.6–11.0)

## 2015-01-23 LAB — URINALYSIS COMPLETE WITH MICROSCOPIC (ARMC ONLY)
BILIRUBIN URINE: NEGATIVE
Glucose, UA: 500 mg/dL — AB
Ketones, ur: NEGATIVE mg/dL
NITRITE: POSITIVE — AB
PH: 6 (ref 5.0–8.0)
PROTEIN: 100 mg/dL — AB
Specific Gravity, Urine: 1.02 (ref 1.005–1.030)

## 2015-01-23 LAB — COMPREHENSIVE METABOLIC PANEL
ALK PHOS: 61 U/L (ref 38–126)
ALT: 19 U/L (ref 14–54)
AST: 33 U/L (ref 15–41)
Albumin: 3.5 g/dL (ref 3.5–5.0)
Anion gap: 13 (ref 5–15)
BUN: 27 mg/dL — AB (ref 6–20)
CALCIUM: 9.1 mg/dL (ref 8.9–10.3)
CO2: 26 mmol/L (ref 22–32)
CREATININE: 1.04 mg/dL — AB (ref 0.44–1.00)
Chloride: 99 mmol/L — ABNORMAL LOW (ref 101–111)
GFR calc Af Amer: 51 mL/min — ABNORMAL LOW (ref >60)
GFR, EST NON AFRICAN AMERICAN: 44 mL/min — AB (ref >60)
Glucose, Bld: 282 mg/dL — ABNORMAL HIGH (ref 65–99)
Potassium: 3.7 mmol/L (ref 3.5–5.1)
Sodium: 138 mmol/L (ref 135–145)
Total Bilirubin: 0.7 mg/dL (ref 0.3–1.2)
Total Protein: 6.9 g/dL (ref 6.5–8.1)

## 2015-01-23 LAB — LIPASE, BLOOD: Lipase: 45 U/L (ref 22–51)

## 2015-01-23 LAB — GLUCOSE, CAPILLARY
GLUCOSE-CAPILLARY: 199 mg/dL — AB (ref 65–99)
GLUCOSE-CAPILLARY: 172 mg/dL — AB (ref 65–99)
Glucose-Capillary: 240 mg/dL — ABNORMAL HIGH (ref 65–99)

## 2015-01-23 LAB — TROPONIN I

## 2015-01-23 LAB — TSH: TSH: 5.447 u[IU]/mL — AB (ref 0.350–4.500)

## 2015-01-23 LAB — MAGNESIUM: Magnesium: 1.7 mg/dL (ref 1.7–2.4)

## 2015-01-23 MED ORDER — ONDANSETRON HCL 4 MG PO TABS: 4.0000 mg | ORAL_TABLET | Freq: Four times a day (QID) | ORAL | Status: DC | PRN

## 2015-01-23 MED ORDER — SODIUM CHLORIDE 0.9 % IJ SOLN
3.0000 mL | Freq: Two times a day (BID) | INTRAMUSCULAR | Status: DC
  Administered 2015-01-24 – 2015-01-25 (×3): 3 mL via INTRAVENOUS

## 2015-01-23 MED ORDER — GLIMEPIRIDE 2 MG PO TABS
2.0000 mg | ORAL_TABLET | Freq: Two times a day (BID) | ORAL | Status: DC
  Administered 2015-01-23 – 2015-01-25 (×5): 2 mg via ORAL
  Filled 2015-01-23 (×5): qty 1

## 2015-01-23 MED ORDER — SODIUM CHLORIDE 0.9 % IV SOLN
INTRAVENOUS | Status: DC
  Administered 2015-01-23 – 2015-01-25 (×4): via INTRAVENOUS

## 2015-01-23 MED ORDER — ENOXAPARIN SODIUM 40 MG/0.4ML ~~LOC~~ SOLN
40.0000 mg | SUBCUTANEOUS | Status: DC
  Administered 2015-01-23 – 2015-01-25 (×3): 40 mg via SUBCUTANEOUS
  Filled 2015-01-23 (×3): qty 0.4

## 2015-01-23 MED ORDER — CRANBERRY 250 MG PO CAPS: 250.0000 mg | ORAL_CAPSULE | Freq: Two times a day (BID) | ORAL | Status: DC

## 2015-01-23 MED ORDER — NYSTATIN 100000 UNIT/GM EX CREA
1.0000 "application " | TOPICAL_CREAM | Freq: Two times a day (BID) | CUTANEOUS | Status: DC
  Administered 2015-01-23 – 2015-01-25 (×4): 1 via TOPICAL
  Filled 2015-01-23: qty 15

## 2015-01-23 MED ORDER — INSULIN ASPART 100 UNIT/ML ~~LOC~~ SOLN
0.0000 [IU] | Freq: Three times a day (TID) | SUBCUTANEOUS | Status: DC
  Administered 2015-01-23: 3 [IU] via SUBCUTANEOUS
  Administered 2015-01-23 – 2015-01-25 (×4): 2 [IU] via SUBCUTANEOUS
  Administered 2015-01-25: 1 [IU] via SUBCUTANEOUS
  Filled 2015-01-23: qty 2
  Filled 2015-01-23: qty 1
  Filled 2015-01-23 (×3): qty 2
  Filled 2015-01-23: qty 3

## 2015-01-23 MED ORDER — ACETAMINOPHEN 325 MG PO TABS: 650.0000 mg | ORAL_TABLET | Freq: Four times a day (QID) | ORAL | Status: DC | PRN

## 2015-01-23 MED ORDER — SENNOSIDES 8.6 MG PO TABS
1.0000 | ORAL_TABLET | Freq: Every day | ORAL | Status: DC
  Administered 2015-01-24 – 2015-01-25 (×2): 8.6 mg via ORAL
  Filled 2015-01-23 (×3): qty 1

## 2015-01-23 MED ORDER — ESTROGENS, CONJUGATED 0.625 MG/GM VA CREA: 1.0000 | TOPICAL_CREAM | VAGINAL | Status: DC

## 2015-01-23 MED ORDER — CALCIUM CARBONATE-VITAMIN D 500-200 MG-UNIT PO TABS
1.0000 | ORAL_TABLET | Freq: Two times a day (BID) | ORAL | Status: DC
  Administered 2015-01-23 – 2015-01-25 (×4): 1 via ORAL
  Filled 2015-01-23 (×4): qty 1

## 2015-01-23 MED ORDER — INFLUENZA VAC SPLIT QUAD 0.5 ML IM SUSY
0.5000 mL | PREFILLED_SYRINGE | INTRAMUSCULAR | Status: AC
  Administered 2015-01-25: 0.5 mL via INTRAMUSCULAR
  Filled 2015-01-23 (×2): qty 0.5

## 2015-01-23 MED ORDER — CARVEDILOL 6.25 MG PO TABS
6.2500 mg | ORAL_TABLET | Freq: Two times a day (BID) | ORAL | Status: DC
  Administered 2015-01-23 – 2015-01-25 (×4): 6.25 mg via ORAL
  Filled 2015-01-23 (×4): qty 1

## 2015-01-23 MED ORDER — LORATADINE 10 MG PO TABS
10.0000 mg | ORAL_TABLET | Freq: Every day | ORAL | Status: DC
  Administered 2015-01-24 – 2015-01-25 (×2): 10 mg via ORAL
  Filled 2015-01-23 (×2): qty 1

## 2015-01-23 MED ORDER — SODIUM CHLORIDE 0.9 % IV BOLUS (SEPSIS)
1000.0000 mL | Freq: Once | INTRAVENOUS | Status: AC
  Administered 2015-01-23: 1000 mL via INTRAVENOUS

## 2015-01-23 MED ORDER — GUAIFENESIN 100 MG/5ML PO SOLN
200.0000 mg | Freq: Four times a day (QID) | ORAL | Status: DC | PRN
Start: 2015-01-23 — End: 2015-01-25
  Administered 2015-01-24: 200 mg via ORAL
  Filled 2015-01-23 (×2): qty 10

## 2015-01-23 MED ORDER — ACETAMINOPHEN 325 MG PO TABS
650.0000 mg | ORAL_TABLET | Freq: Two times a day (BID) | ORAL | Status: DC
  Administered 2015-01-23 – 2015-01-25 (×4): 650 mg via ORAL
  Filled 2015-01-23 (×4): qty 2

## 2015-01-23 MED ORDER — ONDANSETRON HCL 4 MG/2ML IJ SOLN: 4.0000 mg | Freq: Four times a day (QID) | INTRAMUSCULAR | Status: DC | PRN

## 2015-01-23 MED ORDER — MAGNESIUM SULFATE 2 GM/50ML IV SOLN
2.0000 g | Freq: Once | INTRAVENOUS | Status: AC
  Administered 2015-01-23: 2 g via INTRAVENOUS
  Filled 2015-01-23: qty 50

## 2015-01-23 MED ORDER — ACETAMINOPHEN 650 MG RE SUPP: 650.0000 mg | Freq: Four times a day (QID) | RECTAL | Status: DC | PRN

## 2015-01-23 NOTE — ED Notes (Signed)
Pt arrived via EMS from [facility].  EMS was called out because of loss of consciousness. Staff had originally thought it was related to diabetes, but patient also vomited.  Rhythm strip with EMS showed possible Torsades initially, but repeated 12 Lead with EMS showed NSR. Pt told staff she did not feel well at [facility] [place].  Patient is alert and confused.

## 2015-01-23 NOTE — Progress Notes (Signed)
*  PRELIMINARY RESULTS* Echocardiogram 2D Echocardiogram has been performed.  Cynthia Lynch 01/23/2015, 11:34 AM

## 2015-01-23 NOTE — ED Provider Notes (Signed)
[facility] Emergency Department Provider Note  Time seen: 7:59 AM  I have reviewed the triage vital signs and the nursing notes.   HISTORY  Chief Complaint No chief complaint on file.    HPI Cynthia Lynch is a 81 y.o. female with a past medical history of Alzheimer's, diabetes, presents the emergency department after a syncopal episode and vomiting. According to EMS the patient lives at [facility], this morning she had one episode of vomiting and went unresponsive and EMS was called. Upon EMS arrival they state the patient was awake and alert and able to answer questions. They transported the patient to the emergency department. In route to the hospital the patient began to state that she felt nauseated and did not feel well during this time EMS had a rhythm strip concerning for possible torsades. This rhythm cleared and returned to normal sinus rhythm within several seconds, and the patient stated that she felt much better. Patient denies any chest pain or shortness of breath, but she does have a history significant for Alzheimer's dementia which makes history Limited.     No past medical history on file.  There are no active problems to display for this patient.   No past surgical history on file.  No current outpatient prescriptions on file.  Allergies Review of patient's allergies indicates not on file.  No family history on file.  Social History Social History  Substance Use Topics  . Smoking status: Not on file  . Smokeless tobacco: Not on file  . Alcohol Use: Not on file    Review of Systems Constitutional: Negative for fever. Cardiovascular: Negative for chest pain. Respiratory: Negative for shortness of breath. Gastrointestinal: Negative for abdominal pain Musculoskeletal: Negative for back pain 10-point ROS otherwise negative, although limited by Alzheimer's  dementia.  ____________________________________________   PHYSICAL EXAM:  Constitutional: Alert. Well appearing and in no distress. Calm, pleasant. Eyes: Normal exam ENT   Head: Normocephalic and atraumatic   Mouth/Throat: Mucous membranes are moist. Cardiovascular: Normal rate, regular rhythm around 75 bpm. Respiratory: Normal respiratory effort without tachypnea nor retractions. Breath sounds are clear and equal bilaterally. No wheezes/rales/rhonchi. Gastrointestinal: Soft, mild distention, no abdominal tenderness to palpation. Musculoskeletal: Nontender with normal range of motion in all extremities. No lower extremity tenderness or edema. Neurologic:  Normal speech and language. No gross focal neurologic deficits  Skin:  Skin is warm, dry and intact.  Psychiatric: Mood and affect are normal.   ____________________________________________    EKG  EKG reviewed and interpreted by myself shows normal sinus rhythm at 76 bpm, narrow QRS, normal axis, normal intervals, nonspecific ST changes are present. No ST elevations noted.  ____________________________________________    RADIOLOGY  No acute abnormality on chest x-ray  ____________________________________________   INITIAL IMPRESSION / ASSESSMENT AND PLAN / ED COURSE  Pertinent labs & imaging results that were available during my care of the patient were reviewed by me and considered in my medical decision making (see chart for details).  Patient with syncope, unresponsiveness, and nausea/vomiting. Currently the patient appears very well, is calm, pleasant, speaking without difficulty. Denies any symptoms at this time but has a history of dementia which strongly limits her history. EMS states during the time that the patient did not feel well they were rhythm strip which I have reviewed which appears concerning for torsades. I placed the patient on nasal monitor, we will begin IV magnesium infusion while awaiting  laboratory results. Anticipated admission.  Labs have resulted in largely normal results, urinalysis pending. Chest x-ray within normal limits. Patient will be admitted to the hospital given her syncopal episode, and likely torsades on rhythm strip. Patient currently receiving IV magnesium infusion. ____________________________________________   FINAL CLINICAL IMPRESSION(S) / ED DIAGNOSES  Unresponsiveness  SyncopeTorsades   Harvest Dark, MD 01/23/15 [phone]

## 2015-01-23 NOTE — Consult Note (Signed)
[facility] Consultation Note  Patient ID: MANASVI DICKARD, MRN: [id number], DOB/AGE: 06/07/1916 81 y.o. Admit date: 01/23/2015   Date of Consult: 01/23/2015 Primary Physician: Rusty Aus., MD Primary Cardiologist: None  Chief Complaint:  Chief Complaint  Patient presents with  . Loss of Consciousness   Reason for Consult: syncope with essential hypertension hyperlipidemia and arrhythmia  HPI: 81 y.o. female with known evidence of the essential hypertension diabetes with complication mixed hyperlipidemia and previous history of falls with a new onset of syncopal episode of unknown etiology for which the patient fell again. When arriving to the emergency room after this fall the patient had some rapid tachycardia of unknown etiology which was somewhat wide complex and spontaneously resolved. It was not clear whether the patient had any significant symptoms with this rhythm. Has been no evidence of myocardial infarction by normal troponin and no electrolyte abnormalities or significant chronic kidney disease causing any significant stability of rhythm disturbances. The patient has been on appropriate medication management in the past for diabetes stable at this time. Hypertension has been controlled without medication at this time and no addition of hyperlipidemia medication management has occurred since being in the nursing home. Since admission the patient has not had any further rhythm disturbances syncope and/or other significant symptoms  Past Medical History  Diagnosis Date  . Alzheimer's dementia   . Subarchnoid hemorrhage-brief coma   . Hematoma     right forehead  . Chronic kidney disease (CKD), stage III (moderate)   . Diabetes mellitus without complication   . Hypertension   . Hyperlipidemia   . Cancer     breast, ovarian      Surgical History:  Past Surgical History  Procedure Laterality Date  . Eye surgery    . Mastectomy partial / lumpectomy Left   .  Abdominal hysterectomy      daughter not sure, pt unable to tell     Home Meds: Prior to Admission medications   Medication Sig Start Date End Date Taking? Authorizing Provider  acetaminophen (TYLENOL) 325 MG tablet Take 650 mg by mouth 2 (two) times daily.   Yes Historical Provider, MD  Calcium Citrate-Vitamin D (CITRACAL MAXIMUM) 315-250 MG-UNIT TABS Take 1 tablet by mouth 2 (two) times daily.   Yes Historical Provider, MD  conjugated estrogens (PREMARIN) vaginal cream Place 1 Applicatorful vaginally every Monday, Wednesday, and Friday. Apply pea sized amount to uretheral openings at bedtime on Monday, Wednesday, and Friday.   Yes Historical Provider, MD  Cranberry 250 MG CAPS Take 250 mg by mouth 2 (two) times daily.   Yes Historical Provider, MD  fexofenadine (ALLEGRA) 60 MG tablet Take 60 mg by mouth 2 (two) times daily.   Yes Historical Provider, MD  glimepiride (AMARYL) 2 MG tablet Take 2 mg by mouth 2 (two) times daily.   Yes Historical Provider, MD  guaiFENesin (ROBITUSSIN) 100 MG/5ML liquid Take 200 mg by mouth every 6 (six) hours as needed for cough. Take up to 48 hours.   Yes Historical Provider, MD  hydrochlorothiazide (HYDRODIURIL) 25 MG tablet Take 12.5 mg by mouth daily. 01/16/15  Yes Historical Provider, MD  nystatin cream (MYCOSTATIN) Apply 1 application topically 2 (two) times daily. Apply topically in the crease of legs of groin.   Yes Historical Provider, MD  senna (SENOKOT) 8.6 MG tablet Take 1 tablet by mouth daily.   Yes Historical Provider, MD    Inpatient Medications:  . acetaminophen  650 mg Oral BID  .  calcium-vitamin D  1 tablet Oral BID  . carvedilol  6.25 mg Oral BID WC  . [START ON 01/25/2015] conjugated estrogens  1 Applicatorful Vaginal Q M,W,F  . enoxaparin (LOVENOX) injection  40 mg Subcutaneous Q24H  . glimepiride  2 mg Oral BID  . insulin aspart  0-9 Units Subcutaneous TID WC  . loratadine  10 mg Oral Daily  . nystatin cream  1 application Topical BID   . senna  1 tablet Oral Daily  . sodium chloride  3 mL Intravenous Q12H   . sodium chloride      Allergies: No Known Allergies  Social History   Social History  . Marital Status: Widowed    Spouse Name: N/A  . Number of Children: N/A  . Years of Education: N/A   Occupational History  . Not on file.   Social History Main Topics  . Smoking status: Never Smoker   . Smokeless tobacco: Never Used  . Alcohol Use: No  . Drug Use: No  . Sexual Activity: Not Currently   Other Topics Concern  . Not on file   Social History Narrative  . No narrative on file     Family History  Problem Relation Age of Onset  . Hypertension       Review of Systems  Unable to assess  Labs:  Recent Labs  01/23/15 0809  TROPONINI <0.03   Lab Results  Component Value Date   WBC 12.5* 01/23/2015   HGB 13.6 01/23/2015   HCT 40.9 01/23/2015   MCV 91.8 01/23/2015   PLT 171 01/23/2015    Recent Labs Lab 01/23/15 0809  NA 138  K 3.7  CL 99*  CO2 26  BUN 27*  CREATININE 1.04*  CALCIUM 9.1  PROT 6.9  BILITOT 0.7  ALKPHOS 61  ALT 19  AST 33  GLUCOSE 282*   No results found for: CHOL, HDL, LDLCALC, TRIG No results found for: DDIMER  Radiology/Studies:  Dg Chest Portable 1 View  01/23/2015   CLINICAL DATA:  81 year old female with near syncopal event.  EXAM: PORTABLE CHEST - 1 VIEW  COMPARISON:  Chest x-ray 05/28/2013.  FINDINGS: Lung volumes are low. Defibrillator pads projecting over the left hemithorax and lower central thorax. No acute consolidative airspace disease. No pleural effusions. No evidence of pulmonary edema. No pneumothorax. Heart size is mildly enlarged. The patient is rotated to the right on today's exam, resulting in distortion of the mediastinal contours and reduced diagnostic sensitivity and specificity for mediastinal pathology. Atherosclerosis in the thoracic aorta.  IMPRESSION: 1. Low lung volumes without radiographic evidence of acute cardiopulmonary disease.  2. Mild cardiomegaly. 3. Atherosclerosis.   Electronically Signed   By: Vinnie Langton M.D.   On: 01/23/2015 08:39    EKG: Normal sinus rhythm  Weights: Filed Weights   01/23/15 0805  Weight: 154 lb 8 oz (70.081 kg)     Physical Exam: Blood pressure 132/67, pulse 70, temperature 97.7 F (36.5 C), temperature source Oral, resp. rate 18, height 5\' 2"  (1.575 m), weight 154 lb 8 oz (70.081 kg), SpO2 100 %. Body mass index is 28.25 kg/(m^2). General: Well developed, well nourished, in no acute distress. Head eyes ears nose throat: Normocephalic, atraumatic, sclera non-icteric, no xanthomas, nares are without discharge. No apparent thyromegaly and/or mass  Lungs: Normal respiratory effort.  no wheezes, no rales, few rhonchi.  Heart: RRR with normal S1 S2. no murmur gallop, no rub, PMI is normal size and placement, carotid upstroke normal  without bruit, jugular venous pressure is normal Abdomen: Soft, non-tender, non-distended with normoactive bowel sounds. No hepatomegaly. No rebound/guarding. No obvious abdominal masses. Abdominal aorta is normal size without bruit Extremities: Trace edema. no cyanosis, no clubbing, no ulcers  Peripheral : 2+ bilateral upper extremity pulses, 2+ bilateral femoral pulses, 2+ bilateral dorsal pedal pulse Neuro: Not Alert and oriented. No facial asymmetry. No focal deficit. Moves all extremities spontaneously. Musculoskeletal: Normal muscle tone with moderate kyphosis     Assessment: 81 year old female with essential hypertension diabetes with complication mixed hyperlipidemia and history of falls and syncope with recurrent episode of fall and syncope and known etiology without evidence of myocardial infarction or congestive heart failure at this time. There has been concerns of rhythm disturbances for which we will continue to follow  Plan: 1. Telemetry to assess for any recurrence of rhythm disturbances or any other need for additional medication  management 2. Echocardiogram for LV systolic dysfunction or concerns of causes of rhythm disturbances and or syncope 3. Continue serial ECG and enzymes to assess for possible myocardial infarction 4. Further diagnostics as necessary based on above  Signed, Corey Skains M.D. [facility] 01/23/2015, 11:51 AM

## 2015-01-23 NOTE — H&P (Signed)
[facility] [place] NAME: Cynthia Lynch    MR#:  [id number]  DATE OF BIRTH:  08/04/1916  DATE OF ADMISSION:  01/23/2015  PRIMARY CARE PHYSICIAN: Rusty Aus., MD   REQUESTING/REFERRING PHYSICIAN:   CHIEF COMPLAINT:   Chief Complaint  Patient presents with  . Loss of Consciousness    HISTORY OF PRESENT ILLNESS: Cynthia Lynch  is a 81 y.o. female with a known history of diabetes type 2, hypertension, hyperlipidemia, history of subdural hematoma after fall who currently resides in assisted living. Who was brought to the emergency room, she was having difficulty with forming words. Subsequently became nauseous and was throwing up when EMS picked her up she had a episode of unresponsiveness with syncope. Patient was brought to the emergency room and was noted to have a run of torsades. She has dementia and unable to give any history daughter is at the bedside  PAST MEDICAL HISTORY:   Past Medical History  Diagnosis Date  . Alzheimer's dementia   . Subarchnoid hemorrhage-brief coma   . Hematoma     right forehead  . Chronic kidney disease (CKD), stage III (moderate)   . Diabetes mellitus without complication   . Hypertension   . Hyperlipidemia   . Cancer     breast, ovarian    PAST SURGICAL HISTORY:  Past Surgical History  Procedure Laterality Date  . Eye surgery    . Mastectomy partial / lumpectomy Left   . Abdominal hysterectomy      daughter not sure, pt unable to tell    SOCIAL HISTORY:  Social History  Substance Use Topics  . Smoking status: Never Smoker   . Smokeless tobacco: Never Used  . Alcohol Use: No    FAMILY HISTORY:  Family History  Problem Relation Age of Onset  . Hypertension      DRUG ALLERGIES: No Known Allergies  REVIEW OF SYSTEMS:  Patient unable to provide review of systems due to her dementia  CONSTITUTIONAL: No fever,    MEDICATIONS AT HOME:  Tylenol 650 mg 2 times a  day Calcium citrate 1 tab twice a day Premarin vaginal cream Cranberry 250 mg 2 times daily Fexofenadine 60 mg 1 tab by mouth twice a day Glimepiride 2 mg 2 tabs 2 times daily Guaifenesin 200 mg every 6 hours as needed  HCTZ 12.5 daily Nystatin cream 2 times daily Senokot 1 tab by mouth daily     Prior to Admission medications   Medication Sig Start Date End Date Taking? Authorizing Provider  hydrochlorothiazide (HYDRODIURIL) 25 MG tablet Take 12.5 mg by mouth daily. 01/16/15  Yes Historical Provider, MD      PHYSICAL EXAMINATION:   VITAL SIGNS: Blood pressure 147/67, pulse 74, temperature 98 F (36.7 C), temperature source Oral, resp. rate 19, height 5\' 2"  (1.575 m), weight 70.081 kg (154 lb 8 oz), SpO2 100 %.  GENERAL:  81 y.o.-year-old patient lying in the bed with no acute distress.  EYES: Pupils equal, round, reactive to light and accommodation. No scleral icterus. Extraocular muscles intact.  HEENT: Head atraumatic, normocephalic. Oropharynx and nasopharynx clear.  NECK:  Supple, no jugular venous distention. No thyroid enlargement, no tenderness.  LUNGS: Normal breath sounds bilaterally, no wheezing, rales,rhonchi or crepitation. No use of accessory muscles of respiration.  CARDIOVASCULAR: S1, S2 normal. No murmurs, rubs, or gallops.  ABDOMEN: Soft, nontender, nondistended. Bowel sounds present. No organomegaly or mass.  EXTREMITIES: No pedal edema, cyanosis, or clubbing.  NEUROLOGIC: Cranial nerves II through XII are intact. Muscle strength 5/5 in all extremities. Sensation intact. Gait not checked.  PSYCHIATRIC: The patient is alert and oriented x 3.  SKIN: No obvious rash, lesion, or ulcer.   LABORATORY PANEL:   CBC  Recent Labs Lab 01/23/15 0809  WBC 12.5*  HGB 13.6  HCT 40.9  PLT 171  MCV 91.8  MCH 30.4  MCHC 33.1  RDW 14.1  LYMPHSABS 2.1  MONOABS 0.8  EOSABS 0.1  BASOSABS 0.1    ------------------------------------------------------------------------------------------------------------------  Chemistries   Recent Labs Lab 01/23/15 0809  NA 138  K 3.7  CL 99*  CO2 26  GLUCOSE 282*  BUN 27*  CREATININE 1.04*  CALCIUM 9.1  MG 1.7  AST 33  ALT 19  ALKPHOS 61  BILITOT 0.7   ------------------------------------------------------------------------------------------------------------------ estimated creatinine clearance is 29.7 mL/min (by C-G formula based on Cr of 1.04). ------------------------------------------------------------------------------------------------------------------ No results for input(s): TSH, T4TOTAL, T3FREE, THYROIDAB in the last 72 hours.  Invalid input(s): FREET3   Coagulation profile No results for input(s): INR, PROTIME in the last 168 hours. ------------------------------------------------------------------------------------------------------------------- No results for input(s): DDIMER in the last 72 hours. -------------------------------------------------------------------------------------------------------------------  Cardiac Enzymes  Recent Labs Lab 01/23/15 0809  TROPONINI <0.03   ------------------------------------------------------------------------------------------------------------------ Invalid input(s): POCBNP  ---------------------------------------------------------------------------------------------------------------  Urinalysis    Component Value Date/Time   COLORURINE Yellow 08/24/2013 1225   APPEARANCEUR Hazy 08/24/2013 1225   LABSPEC 1.010 08/24/2013 1225   PHURINE 6.0 08/24/2013 1225   GLUCOSEU >=500 08/24/2013 1225   HGBUR Negative 08/24/2013 1225   BILIRUBINUR Negative 08/24/2013 1225   KETONESUR Negative 08/24/2013 1225   PROTEINUR Negative 08/24/2013 1225   NITRITE Negative 08/24/2013 1225   LEUKOCYTESUR 1+ 08/24/2013 1225     RADIOLOGY: Dg Chest Portable 1 View  01/23/2015    CLINICAL DATA:  81 year old female with near syncopal event.  EXAM: PORTABLE CHEST - 1 VIEW  COMPARISON:  Chest x-ray 05/28/2013.  FINDINGS: Lung volumes are low. Defibrillator pads projecting over the left hemithorax and lower central thorax. No acute consolidative airspace disease. No pleural effusions. No evidence of pulmonary edema. No pneumothorax. Heart size is mildly enlarged. The patient is rotated to the right on today's exam, resulting in distortion of the mediastinal contours and reduced diagnostic sensitivity and specificity for mediastinal pathology. Atherosclerosis in the thoracic aorta.  IMPRESSION: 1. Low lung volumes without radiographic evidence of acute cardiopulmonary disease. 2. Mild cardiomegaly. 3. Atherosclerosis.   Electronically Signed   By: Vinnie Langton M.D.   On: 01/23/2015 08:39    EKG: Orders placed or performed in visit on 08/24/13  . EKG 12-Lead    IMPRESSION AND PLAN: Patient is a 81 year old white female with episode of syncope  1. Syncope with collapse: Suspect due to life threatening arrhythmia, at this time will admit her to telemetry. Check serial cardiac enzymes and echocardiogram of the heart cardiology consult  2. Diabetes type 2: Place her on sliding scale insulin continue her oral treatment  3. Hypertension I'll replace her HCTZ for a beta blocker.  4. Dementia  5. Code: Had a lengthy discussion with the daughter regarding [place] she is very hesitant about DO NOT RESUSCITATE. Feels that it means that she would not be treated I explained her that is not the case she reports that she'll discuss with her sister regarding that and let us know  All the records are reviewed and case discussed with ED provider. Management plans discussed with the patient, family and they are in  agreement.  CODE STATUS: Full Advance Directive Documentation        Most Recent Value   Type of Advance Directive  Healthcare Power of Attorney, Living will    Pre-existing out of facility DNR order (yellow form or pink MOST form)     "MOST" Form in Place?         TOTAL TIME TAKING CARE OF THIS PATIENT: 55 minutes.    Dustin Flock M.D on 01/23/2015 at 9:39 AM  Between 7am to 6pm - Pager - [phone]  After 6pm go to [email or web address] - password [email or web address] [facility]  [facility]  Office  [phone]  CC: Primary care physician; Rusty Aus., MD

## 2015-01-24 DIAGNOSIS — R55 Syncope and collapse: Secondary | ICD-10-CM | POA: Diagnosis not present

## 2015-01-24 LAB — URINALYSIS COMPLETE WITH MICROSCOPIC (ARMC ONLY)
Bilirubin Urine: NEGATIVE
Glucose, UA: NEGATIVE mg/dL
KETONES UR: NEGATIVE mg/dL
Nitrite: POSITIVE — AB
PH: 6 (ref 5.0–8.0)
PROTEIN: 30 mg/dL — AB
Specific Gravity, Urine: 1.012 (ref 1.005–1.030)

## 2015-01-24 LAB — GLUCOSE, CAPILLARY
GLUCOSE-CAPILLARY: 169 mg/dL — AB (ref 65–99)
GLUCOSE-CAPILLARY: 125 mg/dL — AB (ref 65–99)
GLUCOSE-CAPILLARY: 145 mg/dL — AB (ref 65–99)
Glucose-Capillary: 181 mg/dL — ABNORMAL HIGH (ref 65–99)

## 2015-01-24 MED ORDER — CEFTRIAXONE SODIUM 1 G IJ SOLR
1.0000 g | INTRAMUSCULAR | Status: DC
  Administered 2015-01-24: 1 g via INTRAVENOUS
  Filled 2015-01-24 (×2): qty 10

## 2015-01-24 NOTE — Plan of Care (Signed)
Problem: Phase I Progression Outcomes Goal: Pain controlled with appropriate interventions Outcome: Completed/Met Date Met:  01/24/15 No voiced complaints or signs  of pain Goal: Voiding-avoid urinary catheter unless indicated Outcome: Completed/Met Date Met:  01/24/15 Continues to void incontinently

## 2015-01-24 NOTE — Progress Notes (Signed)
[facility] Encounter Note  Patient: Cynthia Lynch / Admit Date: 01/23/2015 / Date of Encounter: 01/24/2015, 11:46 AM   Subjective: Patient cannot express any subjective issues. No evidence of rhythm disturbances  Review of Systems:  Unable to assess  Objective: Telemetry: Normal sinus rhythm Physical Exam: Blood pressure 147/61, pulse 56, temperature 97.7 F (36.5 C), temperature source Oral, resp. rate 16, height 5\' 2"  (1.575 m), weight 151 lb 4.8 oz (68.629 kg), SpO2 85 %. Body mass index is 27.67 kg/(m^2). General: Well developed, well nourished, in no acute distress. Head: Normocephalic, atraumatic, sclera non-icteric, no xanthomas, nares are without discharge. Neck: No apparent masses Lungs: Normal respirations with no wheezes, no rhonchi, no rales , few basilar crackles   Heart: Regular rate and rhythm, normal S1 S2, no murmur, no rub, no gallop, PMI is normal size and placement, carotid upstroke normal without bruit, jugular venous pressure normal Abdomen: Soft, non-tender, non-distended with normoactive bowel sounds. No hepatosplenomegaly. Abdominal aorta is normal size without bruit Extremities: Trace edema, no clubbing, no cyanosis, no ulcers,  Peripheral: 2+ radial, 2+ femoral, 2+ dorsal pedal pulses Neuro: Alert and not oriented. Moves all extremities spontaneously.    Intake/Output Summary (Last 24 hours) at 01/24/15 1146 Last data filed at 01/24/15 1047  Gross per 24 hour  Intake 1630.5 ml  Output    700 ml  Net  930.5 ml    Inpatient Medications:  . acetaminophen  650 mg Oral BID  . calcium-vitamin D  1 tablet Oral BID  . carvedilol  6.25 mg Oral BID WC  . cefTRIAXone (ROCEPHIN)  IV  1 g Intravenous Q24H  . [START ON 01/25/2015] conjugated estrogens  1 Applicatorful Vaginal Q M,W,F  . enoxaparin (LOVENOX) injection  40 mg Subcutaneous Q24H  . glimepiride  2 mg Oral BID  . Influenza vac split quadrivalent PF  0.5 mL Intramuscular  Tomorrow-1000  . insulin aspart  0-9 Units Subcutaneous TID WC  . loratadine  10 mg Oral Daily  . nystatin cream  1 application Topical BID  . senna  1 tablet Oral Daily  . sodium chloride  3 mL Intravenous Q12H   Infusions:  . sodium chloride 75 mL/hr at 01/24/15 0145    Labs:  Recent Labs  01/23/15 0809  NA 138  K 3.7  CL 99*  CO2 26  GLUCOSE 282*  BUN 27*  CREATININE 1.04*  CALCIUM 9.1  MG 1.7    Recent Labs  01/23/15 0809  AST 33  ALT 19  ALKPHOS 61  BILITOT 0.7  PROT 6.9  ALBUMIN 3.5    Recent Labs  01/23/15 0809  WBC 12.5*  NEUTROABS 9.4*  HGB 13.6  HCT 40.9  MCV 91.8  PLT 171    Recent Labs  01/23/15 0809  TROPONINI <0.03   Invalid input(s): POCBNP No results for input(s): HGBA1C in the last 72 hours.   Weights: Filed Weights   01/23/15 0805 01/23/15 1023  Weight: 154 lb 8 oz (70.081 kg) 151 lb 4.8 oz (68.629 kg)     Radiology/Studies:  Dg Chest Portable 1 View  01/23/2015   CLINICAL DATA:  81 year old female with near syncopal event.  EXAM: PORTABLE CHEST - 1 VIEW  COMPARISON:  Chest x-ray 05/28/2013.  FINDINGS: Lung volumes are low. Defibrillator pads projecting over the left hemithorax and lower central thorax. No acute consolidative airspace disease. No pleural effusions. No evidence of pulmonary edema. No pneumothorax. Heart size is mildly enlarged. The patient is  rotated to the right on today's exam, resulting in distortion of the mediastinal contours and reduced diagnostic sensitivity and specificity for mediastinal pathology. Atherosclerosis in the thoracic aorta.  IMPRESSION: 1. Low lung volumes without radiographic evidence of acute cardiopulmonary disease. 2. Mild cardiomegaly. 3. Atherosclerosis.   Electronically Signed   By: Vinnie Langton M.D.   On: 01/23/2015 08:39     Assessment and Recommendation  81 y.o. female with known essential hypertension diabetes with complication mixed hyperlipidemia with episode of syncope  likely secondary to urinary tract infection hypotension and dehydration. The patient did have some telemetry change while in the emergency room which appears to be more artifact than true rhythm disturbance. The patient had greater than 20 seconds of this and did not have any symptoms at the time or had any syncope based on history additionally the patient did not require to call cardioversion for this. Therefore the likelihood is that this is artifact and no further rhythm disturbances have occurred since admission 1. Continue treatment of hypotension dehydration and urinary tract infection likely high as cause of symptoms listed above 2. Continue beta blocker for heart rate control and blood pressure control for further risk reduction of rhythm disturbances 3. Echocardiogram pending 4. Continue telemetry at this time  Signed, Serafina Royals M.D. FACC

## 2015-01-24 NOTE — Progress Notes (Signed)
Was able to stand patient to use bedside commode for urine sample. Patient has just been bathed before. Checked with Dr. Ola Spurr to see if patient needed to be in and out cathed for urine sample. MD stated okay to send sample collected in hat.

## 2015-01-24 NOTE — Clinical Social Work Note (Signed)
Patient from [facility]. CSW has prepared FL2 and placed on chart. Full assessment to follow. Shela Leff MSW,LCSW [phone]

## 2015-01-24 NOTE — Care Management Note (Signed)
Case Management Note  Patient Details  Name: DELORIS MOGER MRN: [id number] Date of Birth: August 06, 1916  Subjective/Objective:  Code 44 form placed on chart.                  Action/Plan:   Expected Discharge Date:                  Expected Discharge Plan:     In-House Referral:     Discharge planning Services     Post Acute Care Choice:    Choice offered to:     DME Arranged:    DME Agency:     HH Arranged:    [place] Agency:     Status of Service:     Medicare Important Message Given:    Date Medicare IM Given:    Medicare IM give by:    Date Additional Medicare IM Given:    Additional Medicare Important Message give by:     If discussed at [place] of Stay Meetings, dates discussed:    Additional Comments:  Ival Bible, RN 01/24/2015, 10:58 AM

## 2015-01-24 NOTE — Progress Notes (Signed)
Patient is only alert to self, baseline. Patient has had good output today, urine has a strong odor from UTI. No complaints of pain. Patient is able to stand with assist. Appetite is not great, only eating bites of meals. Will put in for dietary to see patient. Sinus rhythm on tele. Will continue to monitor.

## 2015-01-24 NOTE — Progress Notes (Signed)
Cynthia Lynch  PROGRESS NOTE Date of Admission:  01/23/2015     ID: Cynthia Lynch is a 81 y.o. female with is a 81 y.o. female with a known history of diabetes type 2, hypertension, hyperlipidemia, history of subdural hematoma after fall who currently resides in assisted living. Who was brought to the emergency room, she was having difficulty with forming words. Subsequently became nauseous and was throwing up when EMS picked her up she had a episode of unresponsiveness with syncope. Patient was brought to the emergency room and was noted to have a run of torsades. She has dementia and unable to give any history daughter is at the bedside Active Problems:   Syncope and collapse   Subjective: No overnight events on tele. No further dizzyness, not eating much   ROS  Eleven systems are reviewed and negative except per hpi  Medications:  Antibiotics Given (last 72 hours)    None     . acetaminophen  650 mg Oral BID  . calcium-vitamin D  1 tablet Oral BID  . carvedilol  6.25 mg Oral BID WC  . [START ON 01/25/2015] conjugated estrogens  1 Applicatorful Vaginal Q M,W,F  . enoxaparin (LOVENOX) injection  40 mg Subcutaneous Q24H  . glimepiride  2 mg Oral BID  . Influenza vac split quadrivalent PF  0.5 mL Intramuscular Tomorrow-1000  . insulin aspart  0-9 Units Subcutaneous TID WC  . loratadine  10 mg Oral Daily  . nystatin cream  1 application Topical BID  . senna  1 tablet Oral Daily  . sodium chloride  3 mL Intravenous Q12H    Objective: Vital signs in last 24 hours: Temp:  [97.7 F (36.5 C)-98.3 F (36.8 C)] 98.3 F (36.8 C) (08/28 0519) Pulse Rate:  [68-77] 77 (08/28 0519) Resp:  [16-19] 18 (08/27 2030) BP: (116-141)/(64-74) 141/74 mmHg (08/28 0519) SpO2:  [95 %-100 %] 95 % (08/28 0519) Weight:  [68.629 kg (151 lb 4.8 oz)] 68.629 kg (151 lb 4.8 oz) (08/27 1023)  Physical Exam  Constitutional:  Baseline dementia, pleasant interactive HENT: [place]/AT, PERRLA, no scleral  icterus Mouth/Throat: Oropharynx is clear and dry. No oropharyngeal exudate.  Cardiovascular: Normal rate, regular rhythm and normal heart sounds.  Pulmonary/Chest: Effort normal and breath sounds normal. No respiratory distress.  has no wheezes.  Neck = supple, no nuchal rigidity Abdominal: Soft. Bowel sounds are normal.  exhibits no distension. There is no tenderness.  Lymphadenopathy: no cervical adenopathy. No axillary adenopathy Skin: Skin is warm and dry. No rash noted. No erythema.  Psychiatric: a normal mood and affect.  behavior is normal.    Lab Results  Recent Labs  01/23/15 0809  WBC 12.5*  HGB 13.6  HCT 40.9  NA 138  K 3.7  CL 99*  CO2 26  BUN 27*  CREATININE 1.04*    Microbiology:   Studies/Results: Dg Chest Portable 1 View  01/23/2015   CLINICAL DATA:  81 year old female with near syncopal event.  EXAM: PORTABLE CHEST - 1 VIEW  COMPARISON:  Chest x-ray 05/28/2013.  FINDINGS: Lung volumes are low. Defibrillator pads projecting over the left hemithorax and lower central thorax. No acute consolidative airspace disease. No pleural effusions. No evidence of pulmonary edema. No pneumothorax. Heart size is mildly enlarged. The patient is rotated to the right on today's exam, resulting in distortion of the mediastinal contours and reduced diagnostic sensitivity and specificity for mediastinal pathology. Atherosclerosis in the thoracic aorta.  IMPRESSION: 1. Low lung volumes without radiographic  evidence of acute cardiopulmonary disease. 2. Mild cardiomegaly. 3. Atherosclerosis.   Electronically Signed   By: Vinnie Langton M.D.   On: 01/23/2015 08:39    Assessment/Plan: . Syncope with collapse: Card following  - echocardiogram of the heart pending  Diabetes type 2: Place her on sliding scale insulin continue her oral treatment  Hypertension dced  HCTZ - on BBbeta blocker.   Dementia stable   UTI - ua with tntc wbc Check UCX in and out cath and start  ceftriaxone  u.  [place], [place]   01/24/2015, 9:31 AM

## 2015-01-25 DIAGNOSIS — R55 Syncope and collapse: Secondary | ICD-10-CM | POA: Diagnosis not present

## 2015-01-25 LAB — BASIC METABOLIC PANEL
ANION GAP: 8 (ref 5–15)
BUN: 14 mg/dL (ref 6–20)
CHLORIDE: 108 mmol/L (ref 101–111)
CO2: 25 mmol/L (ref 22–32)
Calcium: 8.9 mg/dL (ref 8.9–10.3)
Creatinine, Ser: 0.87 mg/dL (ref 0.44–1.00)
GFR, EST NON AFRICAN AMERICAN: 55 mL/min — AB (ref >60)
Glucose, Bld: 156 mg/dL — ABNORMAL HIGH (ref 65–99)
POTASSIUM: 3.6 mmol/L (ref 3.5–5.1)
SODIUM: 141 mmol/L (ref 135–145)

## 2015-01-25 LAB — CBC
HEMATOCRIT: 39.8 % (ref 35.0–47.0)
HEMOGLOBIN: 13.5 g/dL (ref 12.0–16.0)
MCH: 31.2 pg (ref 26.0–34.0)
MCHC: 33.8 g/dL (ref 32.0–36.0)
MCV: 92.2 fL (ref 80.0–100.0)
Platelets: 152 10*3/uL (ref 150–440)
RBC: 4.32 MIL/uL (ref 3.80–5.20)
RDW: 14.5 % (ref 11.5–14.5)
WBC: 9 10*3/uL (ref 3.6–11.0)

## 2015-01-25 LAB — GLUCOSE, CAPILLARY
GLUCOSE-CAPILLARY: 157 mg/dL — AB (ref 65–99)
Glucose-Capillary: 142 mg/dL — ABNORMAL HIGH (ref 65–99)

## 2015-01-25 MED ORDER — CARVEDILOL 6.25 MG PO TABS: 6.2500 mg | ORAL_TABLET | Freq: Two times a day (BID) | ORAL | Status: DC

## 2015-01-25 MED ORDER — INFLUENZA VAC SPLIT QUAD 0.5 ML IM SUSY: 0.5000 mL | PREFILLED_SYRINGE | INTRAMUSCULAR | Status: DC

## 2015-01-25 MED ORDER — CEFUROXIME AXETIL 250 MG PO TABS: 250.0000 mg | ORAL_TABLET | Freq: Two times a day (BID) | ORAL | Status: DC

## 2015-01-25 MED ORDER — CEFUROXIME AXETIL 250 MG PO TABS
250.0000 mg | ORAL_TABLET | Freq: Two times a day (BID) | ORAL | Status: DC
  Administered 2015-01-25: 250 mg via ORAL
  Filled 2015-01-25: qty 1

## 2015-01-25 NOTE — Clinical Social Work Note (Signed)
Clinical Social Work Assessment  Patient Details  Name: Cynthia Lynch MRN: [id number] Date of Birth: 1917/03/16  Date of referral:  01/25/15               Reason for consult:  Facility Placement (pt if from ALF [facility])                Permission sought to share information with:  Facility Sport and exercise psychologist, Family Supports Permission granted to share information::  No (pt was sleeping and would not wake for assessment.)  Name::        Agency::     Relationship::     Contact Information:     Housing/Transportation Living arrangements for the past 2 months:  [place] of Information:  Adult Children, Facility Patient Interpreter Needed:  None Criminal Activity/Legal Involvement Pertinent to Current Situation/Hospitalization:  No - Comment as needed Significant Relationships:  Adult Children Lives with:  Facility Resident Do you feel safe going back to the place where you live?  Yes Need for family participation in patient care:  Yes (Comment)  Care giving concerns:  Daughter just wanted to know if pt would DC with a DC packet.  CSW stated that she would prepare the DC packet.     Social Worker assessment / plan:  CSW spoke to pt's daughter Cynthia Lynch.  Pt was sleeping at the time of the assessment.  She would not wake for CSW.  Per pt's daughter pt is sometimes oriented and sometimes confused.  Per facility pt is sometimes confused and ambulates with a walker.  CSW spoke to [place] at [facility].  She stated that pt would be able to return to facility once medically cleared.  CSW faxed DC packet and will continue to follow to DC.  Employment status:  Retired Forensic scientist:  Medicare PT Recommendations:    Information / Referral to community resources:     Patient/Family's Response to care:  Pt's daughter was in favor or return to [facility]  Patient/Family's Understanding of and Emotional Response to Diagnosis, Current  Treatment, and Prognosis:  Pt's daughter expressed understanding of this DC plan and was in agreement with it.  Emotional Assessment Appearance:  Appears stated age Attitude/Demeanor/Rapport:  Unable to Assess Affect (typically observed):  Unable to Assess Orientation:  Oriented to Self Alcohol / Substance use:  Never Used Psych involvement (Current and /or in the community):  No (Comment)  Discharge Needs  Concerns to be addressed:  Care Coordination Readmission within the last 30 days:  No Current discharge risk:  None Barriers to Discharge:  No Barriers Identified   Cynthia Argyle, LCSW 01/25/2015, 12:16 PM

## 2015-01-25 NOTE — Clinical Social Work Note (Signed)
CSW notified pt's daughter, RN and facility that pt would DC back to [facility] for CD.  CSW signing off unless further needs arise.

## 2015-01-25 NOTE — Discharge Summary (Signed)
                                                                                    Cynthia Lynch, is a 81 y.o. female  DOB October 29, 1916  MRN [id number].  Admission date:  01/23/2015  Admitting Physician  Dustin Flock, MD  Discharge Date:  01/25/2015    Admission Diagnosis  Torsades de pointes [I47.2] Syncope, unspecified syncope type [R55]  Discharge Diagnoses    Syncope, secondary to urinary tract infection UTI CKD3 Diabetes, non-insulin-requiring Hypertension Hyperlipidemia Dementia, Alzheimer's type, moderate   Past Medical History  Diagnosis Date  . Alzheimer's dementia   . Subarchnoid hemorrhage-brief coma   . Hematoma     right forehead  . Chronic kidney disease (CKD), stage III (moderate)   . Diabetes mellitus without complication   . Hypertension   . Hyperlipidemia   . Cancer     breast, ovarian    Past Surgical History  Procedure Laterality Date  . Eye surgery    . Mastectomy partial / lumpectomy Left   . Abdominal hysterectomy      daughter not sure, pt unable to tell       History of present illness and  Hospital Course:     Kindly see H&P for history of present illness and admission details, please review complete Labs, Consult reports and Test reports for all details in brief  HPI  from the history and physical done on the day of admission    Hospital Course    Patient was admitted, no sign of torsade de pointes according to cardiology, who thought this was due to artifact. Patient was hydrated and given IV Rocephin. Blood pressure was controlled with addition of carvedilol, HCTZ was discontinued. Urine culture pending   Discharge Condition: Stable   Follow UP  Dr. Sabra Heck one week    Discharge Instructions  and  Discharge Medications  Carvedilol 6.25 g twice a day Ceftin 250 mg twice a day 1 week Tylenol 650 g twice a day Citracal 1 tab twice a day Conjugated estrogen vaginal cream one applicator every Monday Wednesday  Friday Cranberry 250 mg pills twice a day Allegra 60 g twice a day Glemipiride 2 mg twice a day Robitussin 1 teaspoon every 6 when necessary Nystatin cream twice a day to legs Senokot 8.6 mg daily  Today   Subjective:   [name] today has no complaints, ready to go home  Objective:   Blood pressure 170/66, pulse 70, temperature 97.6 F (36.4 C), temperature source Oral, resp. rate 16, height 5\' 2"  (1.575 m), weight 68.629 kg (151 lb 4.8 oz), SpO2 97 %.   Exam Awake Alert, Oriented x 3, No new F.N deficits, Normal affect [place].AT,PERRAL Supple Neck,No JVD, No cervical lymphadenopathy appriciated.  Symmetrical Chest wall movement, Good air movement bilaterally, CTAB RRR,No Gallops,Rubs or new Murmurs, Abd Soft, Non tender, No rebound. No Cyanosis, Clubbing or edema, No new Rash or bruise  Total Time in preparing paper work, data evaluation and todays exam - 35 minutes  Redith Drach F. M.D on 01/25/2015 at 7:51 AM

## 2015-01-25 NOTE — Progress Notes (Signed)
Pt. Discharged to [facility] report called to [facility]. Pt. Discharge via daugther instructions given to daughter and pt. IV removed pt. Tolerated well.

## 2015-01-25 NOTE — Care Management Note (Addendum)
Case Management Note  Patient Details  Name: Cynthia Lynch MRN: [id number] Date of Birth: 31-May-1916  Subjective/Objective:    [name] was too confused to participate in a conversation about discharge planning this morning. [name] resides at [facility]. She was admitted 01/23/15 per syncope and unresponsiveness when EMS arrived at [facility] to transport her to the ED. MG:NOIBBCWU, HTN, Diabetes II, Subdural hematoma. She had an episode of some kind of arrhythmia in the ED and a Cardiology Consult was made. Carvedilil was added and HCTZ was discontinued. Will be discharged back to [facility] with a PO antibiotic for her UTI. Requested a home health skilled nursing services order and a face-to-face order from Dr Emily Filbert to monitor Orthostatic BPs, Response to change in BP meds, cardiac status, hydration status, and GU status. A referral for home health skilled nurse will be faxed and called to [facility] after Dr Sabra Heck orders it. CM will continue to follow for discharge planning.                    Action/Plan:   Expected Discharge Date:                  Expected Discharge Plan:     In-House Referral:     Discharge planning Services     Post Acute Care Choice:    Choice offered to:     DME Arranged:    DME Agency:     HH Arranged:    [place] Agency:     Status of Service:     Medicare Important Message Given:    Date Medicare IM Given:    Medicare IM give by:    Date Additional Medicare IM Given:    Additional Medicare Important Message give by:     If discussed at [place] of Stay Meetings, dates discussed:    Additional Comments:  Katiejo Gilroy A, RN 01/25/2015, 9:21 AM

## 2015-01-25 NOTE — Progress Notes (Signed)
[facility] Encounter Note  Patient: Cynthia Lynch / Admit Date: 01/23/2015 / Date of Encounter: 01/25/2015, 8:28 AM   Subjective: No complaints today and patient feels well  Review of Systems:  Unable to assess  Objective: Telemetry: Normal sinus rhythm Physical Exam: Blood pressure 170/66, pulse 70, temperature 97.6 F (36.4 C), temperature source Oral, resp. rate 16, height 5\' 2"  (1.575 m), weight 151 lb 4.8 oz (68.629 kg), SpO2 97 %. Body mass index is 27.67 kg/(m^2). General: Well developed, well nourished, in no acute distress. Head: Normocephalic, atraumatic, sclera non-icteric, no xanthomas, nares are without discharge. Neck: No apparent masses Lungs: Normal respirations with no wheezes, no rhonchi, no rales , few basilar crackles   Heart: Regular rate and rhythm, normal S1 S2, no murmur, no rub, no gallop, PMI is normal size and placement, carotid upstroke normal without bruit, jugular venous pressure normal Abdomen: Soft, non-tender, non-distended with normoactive bowel sounds. No hepatosplenomegaly. Abdominal aorta is normal size without bruit Extremities: Trace edema, no clubbing, no cyanosis, no ulcers,  Peripheral: 2+ radial, 2+ femoral, 2+ dorsal pedal pulses Neuro: Alert and not oriented. Moves all extremities spontaneously.    Intake/Output Summary (Last 24 hours) at 01/25/15 0828 Last data filed at 01/25/15 0755  Gross per 24 hour  Intake 1996.75 ml  Output   1050 ml  Net 946.75 ml    Inpatient Medications:  . acetaminophen  650 mg Oral BID  . calcium-vitamin D  1 tablet Oral BID  . carvedilol  6.25 mg Oral BID WC  . cefUROXime  250 mg Oral BID WC  . conjugated estrogens  1 Applicatorful Vaginal Q M,W,F  . enoxaparin (LOVENOX) injection  40 mg Subcutaneous Q24H  . glimepiride  2 mg Oral BID  . Influenza vac split quadrivalent PF  0.5 mL Intramuscular Tomorrow-1000  . insulin aspart  0-9 Units Subcutaneous TID WC  . loratadine  10  mg Oral Daily  . nystatin cream  1 application Topical BID  . senna  1 tablet Oral Daily  . sodium chloride  3 mL Intravenous Q12H   Infusions:  . sodium chloride 75 mL/hr at 01/25/15 0617    Labs:  Recent Labs  01/23/15 0809 01/25/15 0703  NA 138 141  K 3.7 3.6  CL 99* 108  CO2 26 25  GLUCOSE 282* 156*  BUN 27* 14  CREATININE 1.04* 0.87  CALCIUM 9.1 8.9  MG 1.7  --     Recent Labs  01/23/15 0809  AST 33  ALT 19  ALKPHOS 61  BILITOT 0.7  PROT 6.9  ALBUMIN 3.5    Recent Labs  01/23/15 0809 01/25/15 0703  WBC 12.5* 9.0  NEUTROABS 9.4*  --   HGB 13.6 13.5  HCT 40.9 39.8  MCV 91.8 92.2  PLT 171 152    Recent Labs  01/23/15 0809  TROPONINI <0.03   Invalid input(s): POCBNP No results for input(s): HGBA1C in the last 72 hours.   Weights: Filed Weights   01/23/15 0805 01/23/15 1023  Weight: 154 lb 8 oz (70.081 kg) 151 lb 4.8 oz (68.629 kg)     Radiology/Studies:  Dg Chest Portable 1 View  01/23/2015   CLINICAL DATA:  81 year old female with near syncopal event.  EXAM: PORTABLE CHEST - 1 VIEW  COMPARISON:  Chest x-ray 05/28/2013.  FINDINGS: Lung volumes are low. Defibrillator pads projecting over the left hemithorax and lower central thorax. No acute consolidative airspace disease. No pleural effusions. No evidence of  pulmonary edema. No pneumothorax. Heart size is mildly enlarged. The patient is rotated to the right on today's exam, resulting in distortion of the mediastinal contours and reduced diagnostic sensitivity and specificity for mediastinal pathology. Atherosclerosis in the thoracic aorta.  IMPRESSION: 1. Low lung volumes without radiographic evidence of acute cardiopulmonary disease. 2. Mild cardiomegaly. 3. Atherosclerosis.   Electronically Signed   By: Vinnie Langton M.D.   On: 01/23/2015 08:39     Assessment and Recommendation  81 y.o. female with known essential hypertension diabetes with complication mixed hyperlipidemia with episode of  syncope likely secondary to urinary tract infection hypotension and dehydration. The patient did have some telemetry change while in the emergency room which appears to be more artifact rather than true rhythm disturbance. The patient had greater than 20 seconds of this and did not have any symptoms at the time or had any syncope based on history additionally the patient did not require electrical cardioversion for this. Therefore the likelihood is that this is artifact and no further rhythm disturbances have occurred since admission Additionally there is been no other rhythm disturbances at all during her hospitalization 1. Continue treatment of hypotension dehydration and urinary tract infection likely high as cause of symptoms listed above 2. Continue beta blocker for heart rate control and blood pressure control for further risk reduction of rhythm disturbances 3. Okay for discharge to home from cardiac standpoint on current medical regimen  Signed, Serafina Royals M.D. FACC

## 2015-01-26 LAB — URINE CULTURE: Culture: >=100000

## 2015-01-26 NOTE — Care Management Note (Signed)
Case Management Note  Patient Details  Name: Cynthia Lynch MRN: [id number] Date of Birth: Jun 13, 1916  Subjective/Objective:        Referral faxed to [facility] requesting skilled RN services for [name] at [facility].             Action/Plan:   Expected Discharge Date:                  Expected Discharge Plan:     In-House Referral:     Discharge planning Services     Post Acute Care Choice:    Choice offered to:     DME Arranged:    DME Agency:     HH Arranged:    [place] Agency:     Status of Service:     Medicare Important Message Given:    Date Medicare IM Given:    Medicare IM give by:    Date Additional Medicare IM Given:    Additional Medicare Important Message give by:     If discussed at [place] of Stay Meetings, dates discussed:    Additional Comments:  Malai Lady A, RN 01/26/2015, 6:20 AM

## 2015-10-19 ENCOUNTER — Emergency Department
Admission: EM | Admit: 2015-10-19 | Discharge: 2015-10-19 | Disposition: A | Discharge status: Home / Self Care | Payer: Medicare Other | Attending: Emergency Medicine | Admitting: Emergency Medicine

## 2015-10-19 ENCOUNTER — Encounter: Payer: Self-pay | Admitting: Emergency Medicine

## 2015-10-19 ENCOUNTER — Emergency Department: Payer: Medicare Other

## 2015-10-19 DIAGNOSIS — E785 Hyperlipidemia, unspecified: Secondary | ICD-10-CM | POA: Diagnosis not present

## 2015-10-19 DIAGNOSIS — Z791 Long term (current) use of non-steroidal anti-inflammatories (NSAID): Secondary | ICD-10-CM | POA: Diagnosis not present

## 2015-10-19 DIAGNOSIS — Z79899 Other long term (current) drug therapy: Secondary | ICD-10-CM | POA: Insufficient documentation

## 2015-10-19 DIAGNOSIS — Z7984 Long term (current) use of oral hypoglycemic drugs: Secondary | ICD-10-CM | POA: Diagnosis not present

## 2015-10-19 DIAGNOSIS — N390 Urinary tract infection, site not specified: Secondary | ICD-10-CM

## 2015-10-19 DIAGNOSIS — G309 Alzheimer's disease, unspecified: Secondary | ICD-10-CM | POA: Diagnosis not present

## 2015-10-19 DIAGNOSIS — E1122 Type 2 diabetes mellitus with diabetic chronic kidney disease: Secondary | ICD-10-CM | POA: Diagnosis not present

## 2015-10-19 DIAGNOSIS — I129 Hypertensive chronic kidney disease with stage 1 through stage 4 chronic kidney disease, or unspecified chronic kidney disease: Secondary | ICD-10-CM | POA: Insufficient documentation

## 2015-10-19 DIAGNOSIS — F0280 Dementia in other diseases classified elsewhere without behavioral disturbance: Secondary | ICD-10-CM | POA: Insufficient documentation

## 2015-10-19 DIAGNOSIS — N183 Chronic kidney disease, stage 3 (moderate): Secondary | ICD-10-CM | POA: Diagnosis not present

## 2015-10-19 DIAGNOSIS — Z8543 Personal history of malignant neoplasm of ovary: Secondary | ICD-10-CM | POA: Diagnosis not present

## 2015-10-19 DIAGNOSIS — R569 Unspecified convulsions: Secondary | ICD-10-CM | POA: Diagnosis present

## 2015-10-19 DIAGNOSIS — Z853 Personal history of malignant neoplasm of breast: Secondary | ICD-10-CM | POA: Diagnosis not present

## 2015-10-19 LAB — URINALYSIS COMPLETE WITH MICROSCOPIC (ARMC ONLY)
Bilirubin Urine: NEGATIVE
GLUCOSE, UA: 150 mg/dL — AB
Ketones, ur: NEGATIVE mg/dL
NITRITE: POSITIVE — AB
PH: 5 (ref 5.0–8.0)
PROTEIN: 100 mg/dL — AB
SPECIFIC GRAVITY, URINE: 1.016 (ref 1.005–1.030)

## 2015-10-19 LAB — BASIC METABOLIC PANEL
ANION GAP: 13 (ref 5–15)
BUN: 22 mg/dL — AB (ref 6–20)
CO2: 22 mmol/L (ref 22–32)
Calcium: 9.5 mg/dL (ref 8.9–10.3)
Chloride: 102 mmol/L (ref 101–111)
Creatinine, Ser: 0.92 mg/dL (ref 0.44–1.00)
GFR calc Af Amer: 59 mL/min — ABNORMAL LOW (ref >60)
GFR, EST NON AFRICAN AMERICAN: 51 mL/min — AB (ref >60)
Glucose, Bld: 201 mg/dL — ABNORMAL HIGH (ref 65–99)
POTASSIUM: 4.4 mmol/L (ref 3.5–5.1)
SODIUM: 137 mmol/L (ref 135–145)

## 2015-10-19 LAB — CBC WITH DIFFERENTIAL/PLATELET
Basophils Absolute: 0.1 10*3/uL (ref 0–0.1)
Basophils Relative: 1 %
EOS ABS: 0.2 10*3/uL (ref 0–0.7)
HCT: 40.1 % (ref 35.0–47.0)
HEMOGLOBIN: 13.6 g/dL (ref 12.0–16.0)
LYMPHS ABS: 2 10*3/uL (ref 1.0–3.6)
MCH: 30 pg (ref 26.0–34.0)
MCHC: 34 g/dL (ref 32.0–36.0)
MCV: 88.3 fL (ref 80.0–100.0)
Monocytes Absolute: 0.5 10*3/uL (ref 0.2–0.9)
Neutro Abs: 5.9 10*3/uL (ref 1.4–6.5)
Neutrophils Relative %: 68 %
PLATELETS: 198 10*3/uL (ref 150–440)
RBC: 4.55 MIL/uL (ref 3.80–5.20)
RDW: 14 % (ref 11.5–14.5)
WBC: 8.7 10*3/uL (ref 3.6–11.0)

## 2015-10-19 LAB — MAGNESIUM: MAGNESIUM: 2 mg/dL (ref 1.7–2.4)

## 2015-10-19 MED ORDER — CEFTRIAXONE SODIUM 1 G IJ SOLR
1.0000 g | Freq: Once | INTRAMUSCULAR | Status: AC
  Administered 2015-10-19: 1 g via INTRAVENOUS
  Filled 2015-10-19: qty 10

## 2015-10-19 MED ORDER — NITROFURANTOIN MACROCRYSTAL 100 MG PO CAPS: 100.0000 mg | ORAL_CAPSULE | Freq: Four times a day (QID) | ORAL | Status: DC

## 2015-10-19 NOTE — ED Notes (Signed)
Ems from [facility] s/p seizure after breakfast. No known hx seizures. Pt following commands and seems to be at baseline. Pt says she feels same as always. Hx dementia, diabetes, htn, breast and ovarian ca.

## 2015-10-19 NOTE — ED Notes (Signed)
Pt informed to return if any life threatening symptoms occur.  

## 2015-10-19 NOTE — ED Provider Notes (Signed)
Time Seen: Approximately 0 9:15 I have reviewed the triage notes  Chief Complaint: Seizures   History of Present Illness: Cynthia Lynch is a 82 y.o. female who presents via EMS from [facility]. Patient has a known history of Alzheimer's dementia. Patient apparently is at her baseline mental status. She apparently was at breakfast when there was thoughts that she may have had a seizure. Patient apparently had shaking but did not lose any consciousness. The episode seemed to be brief. The patient has no recollection though with her history of dementia and can be an underlying reliable historian. She does deny any pain. Her family arrived later and states that her mental status seems to be back to baseline   Past Medical History  Diagnosis Date  . Alzheimer's dementia   . Subarchnoid hemorrhage-brief coma ([place])   . Hematoma     right forehead  . Chronic kidney disease (CKD), stage III (moderate)   . Diabetes mellitus without complication ([place])   . Hypertension   . Hyperlipidemia   . Cancer [facility])     breast, ovarian    Patient Active Problem List   Diagnosis Date Noted  . Syncope and collapse 01/23/2015    Past Surgical History  Procedure Laterality Date  . Eye surgery    . Mastectomy partial / lumpectomy Left   . Abdominal hysterectomy      daughter not sure, pt unable to tell    Past Surgical History  Procedure Laterality Date  . Eye surgery    . Mastectomy partial / lumpectomy Left   . Abdominal hysterectomy      daughter not sure, pt unable to tell    Current Outpatient Rx  Name  Route  Sig  Dispense  Refill  . acetaminophen (TYLENOL) 325 MG tablet   Oral   Take 650 mg by mouth 2 (two) times daily.         Marland Kitchen amLODipine (NORVASC) 2.5 MG tablet   Oral   Take 2.5 mg by mouth daily.         . Calcium Citrate-Vitamin D (CITRACAL MAXIMUM) 315-250 MG-UNIT TABS   Oral   Take 1 tablet by mouth 2 (two) times daily.         Marland Kitchen conjugated  estrogens (PREMARIN) vaginal cream   Vaginal   Place 1 Applicatorful vaginally every Monday, Wednesday, and Friday. Apply pea sized amount to uretheral openings at bedtime on Monday, Wednesday, and Friday.         . Cranberry 250 MG CAPS   Oral   Take 250 mg by mouth 2 (two) times daily.         . fexofenadine (ALLEGRA) 60 MG tablet   Oral   Take 60 mg by mouth 2 (two) times daily.         Marland Kitchen glimepiride (AMARYL) 2 MG tablet   Oral   Take 2 mg by mouth 2 (two) times daily.         Marland Kitchen guaiFENesin (ROBITUSSIN) 100 MG/5ML liquid   Oral   Take 200 mg by mouth every 6 (six) hours as needed for cough. Take up to 48 hours.         . senna (SENOKOT) 8.6 MG tablet   Oral   Take 1 tablet by mouth daily.         . carvedilol (COREG) 6.25 MG tablet   Oral   Take 1 tablet (6.25 mg total) by mouth 2 (two) times  daily with a meal.   60 tablet   11   . cefUROXime (CEFTIN) 250 MG tablet   Oral   Take 1 tablet (250 mg total) by mouth 2 (two) times daily with a meal. Patient not taking: Reported on 10/19/2015   14 tablet   0   . Influenza vac split quadrivalent PF (FLUARIX) 0.5 ML injection   Intramuscular   Inject 0.5 mLs into the muscle tomorrow at 10 am.   0.5 mL   0   . nitrofurantoin (MACRODANTIN) 100 MG capsule   Oral   Take 1 capsule (100 mg total) by mouth 4 (four) times daily.   28 capsule   0     Allergies:  Review of patient's allergies indicates no known allergies.  Family History: Family History  Problem Relation Age of Onset  . Hypertension      Social History: Social History  Substance Use Topics  . Smoking status: Never Smoker   . Smokeless tobacco: Never Used  . Alcohol Use: No     Review of Systems:   10 point review of systems was performed and was otherwise negative:  Constitutional: No fever Eyes: No visual disturbances ENT: No sore throat, ear pain Cardiac: No chest pain Respiratory: No shortness of breath, wheezing, or  stridor Abdomen: No abdominal pain, no vomiting, No diarrhea Endocrine: No weight loss, No night sweats Extremities: No peripheral edema, cyanosis Skin: No rashes, easy bruising Neurologic: No focal weakness, trouble with speech or swollowing Urologic: No dysuria, Hematuria, or urinary frequency   Physical Exam:  ED Triage Vitals  Enc Vitals Group     BP 10/19/15 0848 151/66 mmHg     Pulse Rate 10/19/15 0848 74     Resp 10/19/15 0930 20     Temp 10/19/15 0848 98.2 F (36.8 C)     Temp Source 10/19/15 0848 Oral     SpO2 10/19/15 0848 99 %     Weight 10/19/15 0848 160 lb (72.576 kg)     Height 10/19/15 0848 5\' 3"  (1.6 m)     Head Cir --      Peak Flow --      Pain Score --      Pain Loc --      Pain Edu? --      Excl. in [place]? --     General: Awake , Alert , and Oriented times 1. Glasgow Coma Scale 15, cooperative Head: Normal cephalic , atraumatic Eyes: Pupils equal , round, reactive to light Nose/Throat: No nasal drainage, patent upper airway without erythema or exudate.  Neck: Supple, Full range of motion, No anterior adenopathy or palpable thyroid masses Lungs: Clear to ascultation without wheezes , rhonchi, or rales Heart: Regular rate, regular rhythm without murmurs , gallops , or rubs Abdomen: Soft, non tender without rebound, guarding , or rigidity; bowel sounds positive and symmetric in all 4 quadrants. No organomegaly .        Extremities: 2 plus symmetric pulses. No edema, clubbing or cyanosis Neurologic: normal ambulation, Motor symmetric without deficits, sensory intact Skin: warm, dry, no rashes   Labs:   All laboratory work was reviewed including any pertinent negatives or positives listed below:  Labs Reviewed  URINALYSIS COMPLETEWITH MICROSCOPIC ([facility] ONLY) - Abnormal; Notable for the following:    Color, Urine YELLOW (*)    APPearance TURBID (*)    Glucose, UA 150 (*)    Hgb urine dipstick 1+ (*)    Protein, ur 100 (*)  Nitrite POSITIVE (*)     Leukocytes, UA 3+ (*)    Bacteria, UA MANY (*)    Squamous Epithelial / LPF 6-30 (*)    All other components within normal limits  BASIC METABOLIC PANEL - Abnormal; Notable for the following:    Glucose, Bld 201 (*)    BUN 22 (*)    GFR calc non Af Amer 51 (*)    GFR calc Af Amer 59 (*)    All other components within normal limits  URINE CULTURE  CBC WITH DIFFERENTIAL/PLATELET  MAGNESIUM  Reviewed the laboratory work is significant for urinary tract infection area urine culture is pending  EKG:  ED ECG REPORT I, Daymon Larsen, the attending physician, personally viewed and interpreted this ECG.  Date: 10/19/2015 EKG Time: 0927 Rate: 65 Rhythm: normal sinus rhythm QRS Axis: normal Intervals: normal ST/T Wave abnormalities: normal Conduction Disturbances: none Narrative Interpretation: unremarkable  Radiology:    EXAM: CT HEAD WITHOUT CONTRAST  TECHNIQUE: Contiguous axial images were obtained from the base of the skull through the vertex without intravenous contrast.  COMPARISON: 08/25/2013  FINDINGS: Chronic left thalamic lacunar infarct is unchanged. There is also a small, chronic infarct in the superior left cerebellum which appears new from the prior study. There is no evidence of acute cortical infarct, intracranial hemorrhage, mass, midline shift, or extra-axial fluid collection. Moderate cerebral atrophy is unchanged. Periventricular white-matter hypodensities are unchanged and nonspecific but compatible with moderate chronic small vessel ischemic disease.  Prior bilateral cataract extraction is noted. The visualized paranasal sinuses and mastoid air cells are clear. No skull fracture is identified. Calcified atherosclerosis is noted at the skull base.  IMPRESSION: 1. No evidence of acute intracranial abnormality. 2. Chronic ischemic changes as above including an interval small cerebellar infarct.   I personally reviewed the radiologic  studies    ED Course:  Patient's stay here was uneventful and I felt since she may be somewhat symptomatic from a urinary tract infection is started on IV antibiotic therapy. Patient's otherwise hemodynamically stable and I felt did not require inpatient management. It is difficult to ascertain whether or not this was actual seizure activity or could've been some right cursor just general shaking and near syncope. Patient's clinical evaluation otherwise appears to be at baseline. Her head CT showed no focal lesions to indicate a potential source for his seizure. The family was at the bedside with explanation results and felt comfortable with discharge back to the nursing facility   Assessment: Acute urinary tract infection Possible Rigors   Final Clinical Impression:   Final diagnoses:  Acute urinary tract infection     Plan: * Outpatient Patient was advised to return immediately if condition worsens. Patient was advised to follow up with their primary care physician or other specialized physicians involved in their outpatient care. The patient and/or family member/power of attorney had laboratory results reviewed at the bedside. All questions and concerns were addressed and appropriate discharge instructions were distributed by the nursing staff. Patient was discharged on Macrodantin for urine coverage with culture pending            Daymon Larsen, MD 10/19/15 [phone]

## 2015-10-19 NOTE — Discharge Instructions (Signed)
Urinary Tract Infection Urinary tract infections (UTIs) can develop anywhere along your urinary tract. Your urinary tract is your body's drainage system for removing wastes and extra water. Your urinary tract includes two kidneys, two ureters, a bladder, and a urethra. Your kidneys are a pair of bean-shaped organs. Each kidney is about the size of your fist. They are located below your ribs, one on each side of your spine. CAUSES Infections are caused by microbes, which are microscopic organisms, including fungi, viruses, and bacteria. These organisms are so small that they can only be seen through a microscope. Bacteria are the microbes that most commonly cause UTIs. SYMPTOMS  Symptoms of UTIs may vary by age and gender of the patient and by the location of the infection. Symptoms in young women typically include a frequent and intense urge to urinate and a painful, burning feeling in the bladder or urethra during urination. Older women and men are more likely to be tired, shaky, and weak and have muscle aches and abdominal pain. A fever may mean the infection is in your kidneys. Other symptoms of a kidney infection include pain in your back or sides below the ribs, nausea, and vomiting. DIAGNOSIS To diagnose a UTI, your caregiver will ask you about your symptoms. Your caregiver will also ask you to provide a urine sample. The urine sample will be tested for bacteria and white blood cells. White blood cells are made by your body to help fight infection. TREATMENT  Typically, UTIs can be treated with medication. Because most UTIs are caused by a bacterial infection, they usually can be treated with the use of antibiotics. The choice of antibiotic and length of treatment depend on your symptoms and the type of bacteria causing your infection. HOME CARE INSTRUCTIONS  If you were prescribed antibiotics, take them exactly as your caregiver instructs you. Finish the medication even if you feel better after  you have only taken some of the medication.  Drink enough water and fluids to keep your urine clear or pale yellow.  Avoid caffeine, tea, and carbonated beverages. They tend to irritate your bladder.  Empty your bladder often. Avoid holding urine for long periods of time.  Empty your bladder before and after sexual intercourse.  After a bowel movement, women should cleanse from front to back. Use each tissue only once. SEEK MEDICAL CARE IF:   You have back pain.  You develop a fever.  Your symptoms do not begin to resolve within 3 days. SEEK IMMEDIATE MEDICAL CARE IF:   You have severe back pain or lower abdominal pain.  You develop chills.  You have nausea or vomiting.  You have continued burning or discomfort with urination. MAKE SURE YOU:   Understand these instructions.  Will watch your condition.  Will get help right away if you are not doing well or get worse.   This information is not intended to replace advice given to you by your health care provider. Make sure you discuss any questions you have with your health care provider.   Document Released: 02/22/2005 Document Revised: 02/03/2015 Document Reviewed: 06/23/2011 Elsevier Interactive Patient Education [employer].  Please return immediately if condition worsens. Please contact her primary physician or the physician you were given for referral. If you have any specialist physicians involved in her treatment and plan please also contact them. Thank you for using [facility].

## 2015-10-22 LAB — URINE CULTURE: Culture: >=100000 — AB

## 2016-05-25 ENCOUNTER — Encounter: Payer: Self-pay | Admitting: Emergency Medicine

## 2016-05-25 ENCOUNTER — Emergency Department: Payer: Medicare Other

## 2016-05-25 ENCOUNTER — Emergency Department
Admission: EM | Admit: 2016-05-25 | Discharge: 2016-05-25 | Disposition: A | Discharge status: Home / Self Care | Payer: Medicare Other | Attending: Emergency Medicine | Admitting: Emergency Medicine

## 2016-05-25 DIAGNOSIS — Z853 Personal history of malignant neoplasm of breast: Secondary | ICD-10-CM | POA: Insufficient documentation

## 2016-05-25 DIAGNOSIS — Y999 Unspecified external cause status: Secondary | ICD-10-CM | POA: Insufficient documentation

## 2016-05-25 DIAGNOSIS — N183 Chronic kidney disease, stage 3 (moderate): Secondary | ICD-10-CM | POA: Insufficient documentation

## 2016-05-25 DIAGNOSIS — S0083XA Contusion of other part of head, initial encounter: Secondary | ICD-10-CM | POA: Diagnosis not present

## 2016-05-25 DIAGNOSIS — S0990XA Unspecified injury of head, initial encounter: Secondary | ICD-10-CM | POA: Diagnosis present

## 2016-05-25 DIAGNOSIS — Y9289 Other specified places as the place of occurrence of the external cause: Secondary | ICD-10-CM | POA: Diagnosis not present

## 2016-05-25 DIAGNOSIS — Z7984 Long term (current) use of oral hypoglycemic drugs: Secondary | ICD-10-CM | POA: Diagnosis not present

## 2016-05-25 DIAGNOSIS — Y9389 Activity, other specified: Secondary | ICD-10-CM | POA: Diagnosis not present

## 2016-05-25 DIAGNOSIS — G309 Alzheimer's disease, unspecified: Secondary | ICD-10-CM | POA: Diagnosis not present

## 2016-05-25 DIAGNOSIS — Z79899 Other long term (current) drug therapy: Secondary | ICD-10-CM | POA: Insufficient documentation

## 2016-05-25 DIAGNOSIS — I129 Hypertensive chronic kidney disease with stage 1 through stage 4 chronic kidney disease, or unspecified chronic kidney disease: Secondary | ICD-10-CM | POA: Insufficient documentation

## 2016-05-25 DIAGNOSIS — W06XXXA Fall from bed, initial encounter: Secondary | ICD-10-CM | POA: Insufficient documentation

## 2016-05-25 DIAGNOSIS — E1122 Type 2 diabetes mellitus with diabetic chronic kidney disease: Secondary | ICD-10-CM | POA: Insufficient documentation

## 2016-05-25 NOTE — ED Notes (Signed)
Per [employer], pt had unwitnessed fall.  Pt. Has large hematoma to forehead.  Pt. Has no complaints of pain.  Pt. Also has swelling to lt. Lower extremity.

## 2016-05-25 NOTE — ED Notes (Signed)
Pt. Being taken back to [facility] via EMS

## 2016-05-25 NOTE — ED Triage Notes (Signed)
Pt brought in by ems from [facility] rolled out of the bed hitting onto [place] floor. Large hematoma to forehead, no other complaints of pain. FSBS 174 per ems.

## 2016-05-25 NOTE — ED Notes (Signed)
Called [facility] and gave report on patient.  Staff will be expecting her.

## 2016-05-25 NOTE — ED Provider Notes (Signed)
[facility] Emergency Department Provider Note   First MD Initiated Contact with Patient 05/25/16 [phone]     (approximate)  I have reviewed the triage vital signs and the nursing notes.   HISTORY  Chief Complaint Fall    HPI Cynthia Lynch is a 82 y.o. female with bolus of chronic medical conditions presents to the emergency department with history per EMS of rolling out of bed while at [facility]. Planes of forehead pain at this time with a current pain score 6 out of 10.   Past Medical History:  Diagnosis Date  . Alzheimer's dementia   . Cancer (HCC)    breast, ovarian  . Chronic kidney disease (CKD), stage III (moderate)   . Diabetes mellitus without complication ([place])   . Hematoma    right forehead  . Hyperlipidemia   . Hypertension   . Subarchnoid hemorrhage-brief coma     Patient Active Problem List   Diagnosis Date Noted  . Syncope and collapse 01/23/2015    Past Surgical History:  Procedure Laterality Date  . ABDOMINAL HYSTERECTOMY     daughter not sure, pt unable to tell  . EYE SURGERY    . MASTECTOMY PARTIAL / LUMPECTOMY Left     Prior to Admission medications   Medication Sig Start Date End Date Taking? Authorizing Provider  acetaminophen (TYLENOL) 325 MG tablet Take 650 mg by mouth 2 (two) times daily.    Historical Provider, MD  amLODipine (NORVASC) 2.5 MG tablet Take 2.5 mg by mouth daily.    Historical Provider, MD  Calcium Citrate-Vitamin D (CITRACAL MAXIMUM) 315-250 MG-UNIT TABS Take 1 tablet by mouth 2 (two) times daily.    Historical Provider, MD  carvedilol (COREG) 6.25 MG tablet Take 1 tablet (6.25 mg total) by mouth 2 (two) times daily with a meal. 01/25/15   Rusty Aus, MD  cefUROXime (CEFTIN) 250 MG tablet Take 1 tablet (250 mg total) by mouth 2 (two) times daily with a meal. Patient not taking: Reported on 10/19/2015 01/25/15   Rusty Aus, MD  conjugated estrogens (PREMARIN) vaginal cream  Place 1 Applicatorful vaginally every Monday, Wednesday, and Friday. Apply pea sized amount to uretheral openings at bedtime on Monday, Wednesday, and Friday.    Historical Provider, MD  Cranberry 250 MG CAPS Take 250 mg by mouth 2 (two) times daily.    Historical Provider, MD  fexofenadine (ALLEGRA) 60 MG tablet Take 60 mg by mouth 2 (two) times daily.    Historical Provider, MD  glimepiride (AMARYL) 2 MG tablet Take 2 mg by mouth 2 (two) times daily.    Historical Provider, MD  guaiFENesin (ROBITUSSIN) 100 MG/5ML liquid Take 200 mg by mouth every 6 (six) hours as needed for cough. Take up to 48 hours.    Historical Provider, MD  Influenza vac split quadrivalent PF (FLUARIX) 0.5 ML injection Inject 0.5 mLs into the muscle tomorrow at 10 am. 01/25/15   Rusty Aus, MD  nitrofurantoin (MACRODANTIN) 100 MG capsule Take 1 capsule (100 mg total) by mouth 4 (four) times daily. 10/19/15   Daymon Larsen, MD  senna (SENOKOT) 8.6 MG tablet Take 1 tablet by mouth daily.    Historical Provider, MD    Allergies Patient has no known allergies.  Family History  Problem Relation Age of Onset  . Hypertension      Social History Social History  Substance Use Topics  . Smoking status: Never Smoker  . Smokeless tobacco:  Never Used  . Alcohol use No    Review of Systems Constitutional: No fever/chills Eyes: No visual changes. ENT: No sore throat. Cardiovascular: Denies chest pain. Respiratory: Denies shortness of breath. Gastrointestinal: No abdominal pain.  No nausea, no vomiting.  No diarrhea.  No constipation. Genitourinary: Negative for dysuria. Musculoskeletal: Negative for back pain. Skin: Negative for rash.Positive for forehead swelling Neurological: Negative for headaches, focal weakness or numbness.  10-point ROS otherwise negative.  ____________________________________________   PHYSICAL EXAM:  VITAL SIGNS: ED Triage Vitals  Enc Vitals Group     BP 05/25/16 0354 (!) 162/110      Pulse Rate 05/25/16 0354 80     Resp 05/25/16 0354 18     Temp 05/25/16 0354 97.4 F (36.3 C)     Temp Source 05/25/16 0354 Oral     SpO2 05/25/16 0354 100 %     Weight 05/25/16 0355 159 lb (72.1 kg)     Height --      Head Circumference --      Peak Flow --      Pain Score 05/25/16 0355 6     Pain Loc --      Pain Edu? --      Excl. in [place]? --     Constitutional: Alert and  Well appearing and in no acute distress. Eyes: Conjunctivae are normal. PERRL. EOMI. Head:  Large forehead contusion with associated swelling and overlying abrasion Ears:  Healthy appearing ear canals and TMs bilaterally Nose: No congestion/rhinnorhea. Mouth/Throat: Mucous membranes are moist.  Oropharynx non-erythematous. Neck: No stridor..  No cervical spine tenderness to palpation. Cardiovascular: Normal rate, regular rhythm. Good peripheral circulation. Grossly normal heart sounds. Respiratory: Normal respiratory effort.  No retractions. Lungs CTAB. Gastrointestinal: Soft and nontender. No distention.  Musculoskeletal: No lower extremity tenderness nor edema. No gross deformities of extremities. Neurologic:  Normal speech and language. No gross focal neurologic deficits are appreciated.  Skin:  Large forehead contusion with associated swelling and overlying abrasion     RADIOLOGY I, [place] N Johnn Krasowski, personally viewed and evaluated these images (plain radiographs) as part of my medical decision making, as well as reviewing the written report by the radiologist.  Ct Head Wo Contrast  Result Date: April 07, 202017 CLINICAL DATA:  Large forehead hematoma.  Fall from bed. EXAM: CT HEAD WITHOUT CONTRAST CT CERVICAL SPINE WITHOUT CONTRAST TECHNIQUE: Multidetector CT imaging of the head and cervical spine was performed following the standard protocol without intravenous contrast. Multiplanar CT image reconstructions of the cervical spine were also generated. COMPARISON:  Head CT 10/19/2015 FINDINGS: CT HEAD  FINDINGS Brain: No mass lesion, intraparenchymal hemorrhage or extra-axial collection. No evidence of acute cortical infarct. There is periventricular hypoattenuation compatible with chronic microvascular disease. There is generalized atrophy. Vascular: Atherosclerotic calcification of the vertebral and internal carotid arteries at the skullbase. Skull: There is a large midline frontal scalp hematoma. Sinuses/Orbits: No sinus fluid levels or advanced mucosal thickening. No mastoid effusion. Normal orbits. CT CERVICAL SPINE FINDINGS Alignment: No static subluxation. Facets are aligned. Occipital condyles are normally positioned. Skull base and vertebrae: No acute fracture. Soft tissues and spinal canal: No prevertebral fluid or swelling. No visible canal hematoma. Disc levels: Multilevel facet arthrosis with moderate bilateral neural foraminal narrowing at C3-4, C4-5 and C5-6. No bony spinal canal stenosis. Upper chest: No pneumothorax, pulmonary nodule or pleural effusion. Other: Normal visualized paraspinal cervical soft tissues. IMPRESSION: 1. Chronic microvascular disease and atrophy without acute intracranial abnormality. 2. Large midline frontal scalp  hematoma. 3. No acute fracture or static subluxation of the cervical spine. Electronically Signed   By: Ulyses Jarred M.D.   On: 07/16/2015 04:41   Ct Cervical Spine Wo Contrast  Result Date: 07/16/2015 CLINICAL DATA:  Large forehead hematoma.  Fall from bed. EXAM: CT HEAD WITHOUT CONTRAST CT CERVICAL SPINE WITHOUT CONTRAST TECHNIQUE: Multidetector CT imaging of the head and cervical spine was performed following the standard protocol without intravenous contrast. Multiplanar CT image reconstructions of the cervical spine were also generated. COMPARISON:  Head CT 10/19/2015 FINDINGS: CT HEAD FINDINGS Brain: No mass lesion, intraparenchymal hemorrhage or extra-axial collection. No evidence of acute cortical infarct. There is periventricular hypoattenuation  compatible with chronic microvascular disease. There is generalized atrophy. Vascular: Atherosclerotic calcification of the vertebral and internal carotid arteries at the skullbase. Skull: There is a large midline frontal scalp hematoma. Sinuses/Orbits: No sinus fluid levels or advanced mucosal thickening. No mastoid effusion. Normal orbits. CT CERVICAL SPINE FINDINGS Alignment: No static subluxation. Facets are aligned. Occipital condyles are normally positioned. Skull base and vertebrae: No acute fracture. Soft tissues and spinal canal: No prevertebral fluid or swelling. No visible canal hematoma. Disc levels: Multilevel facet arthrosis with moderate bilateral neural foraminal narrowing at C3-4, C4-5 and C5-6. No bony spinal canal stenosis. Upper chest: No pneumothorax, pulmonary nodule or pleural effusion. Other: Normal visualized paraspinal cervical soft tissues. IMPRESSION: 1. Chronic microvascular disease and atrophy without acute intracranial abnormality. 2. Large midline frontal scalp hematoma. 3. No acute fracture or static subluxation of the cervical spine. Electronically Signed   By: Ulyses Jarred M.D.   On: 07/16/2015 04:41     Procedures      INITIAL IMPRESSION / ASSESSMENT AND PLAN / ED COURSE  Pertinent labs & imaging results that were available during my care of the patient were reviewed by me and considered in my medical decision making (see chart for details).  82 year old female presents via EMS with history of rolling out of bed with resultant forehead contusion. CT scan of the head revealed no intracranial abnormality. CT scan the cervical spine unremarkable. Ice pack applied to the patient's forehead on arrival to emergency department.   Clinical Course     ____________________________________________  FINAL CLINICAL IMPRESSION(S) / ED DIAGNOSES  Final diagnoses:  Contusion of face, initial encounter     MEDICATIONS GIVEN DURING THIS VISIT:  Medications - No data  to display   NEW OUTPATIENT MEDICATIONS STARTED DURING THIS VISIT:  New Prescriptions   No medications on file    Modified Medications   No medications on file    Discontinued Medications   No medications on file     Note:  This document was prepared using Dragon voice recognition software and may include unintentional dictation errors.    Gregor Hams, MD 05/25/16 [phone]

## 2016-05-27 ENCOUNTER — Emergency Department: Payer: Medicare Other

## 2016-05-27 ENCOUNTER — Emergency Department
Admission: EM | Admit: 2016-05-27 | Discharge: 2016-05-27 | Disposition: A | Discharge status: Home / Self Care | Payer: Medicare Other | Attending: Emergency Medicine | Admitting: Emergency Medicine

## 2016-05-27 DIAGNOSIS — S0990XA Unspecified injury of head, initial encounter: Secondary | ICD-10-CM | POA: Diagnosis present

## 2016-05-27 DIAGNOSIS — S0083XA Contusion of other part of head, initial encounter: Secondary | ICD-10-CM | POA: Insufficient documentation

## 2016-05-27 DIAGNOSIS — Z7984 Long term (current) use of oral hypoglycemic drugs: Secondary | ICD-10-CM | POA: Insufficient documentation

## 2016-05-27 DIAGNOSIS — Y999 Unspecified external cause status: Secondary | ICD-10-CM | POA: Diagnosis not present

## 2016-05-27 DIAGNOSIS — G309 Alzheimer's disease, unspecified: Secondary | ICD-10-CM | POA: Diagnosis not present

## 2016-05-27 DIAGNOSIS — E1122 Type 2 diabetes mellitus with diabetic chronic kidney disease: Secondary | ICD-10-CM | POA: Diagnosis not present

## 2016-05-27 DIAGNOSIS — R262 Difficulty in walking, not elsewhere classified: Secondary | ICD-10-CM

## 2016-05-27 DIAGNOSIS — R319 Hematuria, unspecified: Secondary | ICD-10-CM

## 2016-05-27 DIAGNOSIS — Z79899 Other long term (current) drug therapy: Secondary | ICD-10-CM | POA: Diagnosis not present

## 2016-05-27 DIAGNOSIS — Y929 Unspecified place or not applicable: Secondary | ICD-10-CM | POA: Diagnosis not present

## 2016-05-27 DIAGNOSIS — W06XXXA Fall from bed, initial encounter: Secondary | ICD-10-CM | POA: Insufficient documentation

## 2016-05-27 DIAGNOSIS — I129 Hypertensive chronic kidney disease with stage 1 through stage 4 chronic kidney disease, or unspecified chronic kidney disease: Secondary | ICD-10-CM | POA: Diagnosis not present

## 2016-05-27 DIAGNOSIS — Y939 Activity, unspecified: Secondary | ICD-10-CM | POA: Diagnosis not present

## 2016-05-27 DIAGNOSIS — N183 Chronic kidney disease, stage 3 (moderate): Secondary | ICD-10-CM | POA: Diagnosis not present

## 2016-05-27 DIAGNOSIS — N390 Urinary tract infection, site not specified: Secondary | ICD-10-CM | POA: Diagnosis not present

## 2016-05-27 LAB — URINALYSIS, COMPLETE (UACMP) WITH MICROSCOPIC
Bilirubin Urine: NEGATIVE
Glucose, UA: NEGATIVE mg/dL
Ketones, ur: NEGATIVE mg/dL
Nitrite: POSITIVE — AB
Protein, ur: 100 mg/dL — AB
Specific Gravity, Urine: 1.019 (ref 1.005–1.030)
pH: 5 (ref 5.0–8.0)

## 2016-05-27 LAB — BASIC METABOLIC PANEL
Anion gap: 9 (ref 5–15)
BUN: 23 mg/dL — AB (ref 6–20)
CHLORIDE: 103 mmol/L (ref 101–111)
CO2: 29 mmol/L (ref 22–32)
Calcium: 9.8 mg/dL (ref 8.9–10.3)
Creatinine, Ser: 0.96 mg/dL (ref 0.44–1.00)
GFR calc Af Amer: 56 mL/min — ABNORMAL LOW (ref >60)
GFR calc non Af Amer: 48 mL/min — ABNORMAL LOW (ref >60)
GLUCOSE: 190 mg/dL — AB (ref 65–99)
POTASSIUM: 3.5 mmol/L (ref 3.5–5.1)
Sodium: 141 mmol/L (ref 135–145)

## 2016-05-27 LAB — CBC
HEMATOCRIT: 40.1 % (ref 35.0–47.0)
Hemoglobin: 13.6 g/dL (ref 12.0–16.0)
MCH: 29.8 pg (ref 26.0–34.0)
MCHC: 33.9 g/dL (ref 32.0–36.0)
MCV: 88 fL (ref 80.0–100.0)
Platelets: 191 10*3/uL (ref 150–440)
RBC: 4.55 MIL/uL (ref 3.80–5.20)
RDW: 14.3 % (ref 11.5–14.5)
WBC: 9 10*3/uL (ref 3.6–11.0)

## 2016-05-27 MED ORDER — CEFPODOXIME PROXETIL 200 MG PO TABS: 200.0000 mg | ORAL_TABLET | Freq: Two times a day (BID) | ORAL | 0 refills | Status: DC

## 2016-05-27 MED ORDER — CEFTRIAXONE SODIUM 1 G IJ SOLR: 1.0000 g | Freq: Once | INTRAMUSCULAR | Status: DC

## 2016-05-27 MED ORDER — CEFTRIAXONE SODIUM-DEXTROSE 1-3.74 GM-% IV SOLR
1.0000 g | Freq: Once | INTRAVENOUS | Status: AC
  Administered 2016-05-27: 1 g via INTRAVENOUS
  Filled 2016-05-27: qty 50

## 2016-05-27 NOTE — ED Notes (Signed)
Pt ambulated to wheel chair.

## 2016-05-27 NOTE — ED Provider Notes (Signed)
[facility] Emergency Department Provider Note ____________________________________________   I have reviewed the triage vital signs and the triage nursing note.  HISTORY  Chief Complaint Fall   Historian Patient and two daughters  HPI Cynthia Lynch is a 82 y.o. female who lives in assisted living, typically gets around with a walker, presents today by EMS for evaluation of trouble walking and possible right leg weakness versus right hip pain. Daughter states that she did fall out of bed 2 days ago and was evaluated in the emergency department for hematoma to her forehead and had negative CT head and cervical spine which I was able to review. She went home and last night apparently was having some trouble walking and was reported that maybe that was due to right leg pain. Order was made aware and they decided to wait till morning to reevaluate. When the daughter was called by the nursing home she did agree to have EMS come out and evaluate the patient, and the next thing the daughter heard was that the patient was in the emergency room.  The daughter states that her main priority is to keep her mother in assisted living rather than go to a higher level of care unless she absolutely needs it.  Daughter states that quite she is concerned about is that her mom typically is able to walk, but was seemingly dragging the right leg this morning.  PCP is Dr. Sabra Heck, sounds like this patient has a history of frequent/recurrent UTIs as well. No reported recent illnesses in terms of cough congestion or fevers, diarrhea, or altered mental status from baseline.    Past Medical History:  Diagnosis Date  . Alzheimer's dementia   . Cancer (HCC)    breast, ovarian  . Chronic kidney disease (CKD), stage III (moderate)   . Diabetes mellitus without complication ([place])   . Hematoma    right forehead  . Hyperlipidemia   . Hypertension   . Subarchnoid hemorrhage-brief coma      Patient Active Problem List   Diagnosis Date Noted  . Syncope and collapse 01/23/2015    Past Surgical History:  Procedure Laterality Date  . ABDOMINAL HYSTERECTOMY     daughter not sure, pt unable to tell  . EYE SURGERY    . MASTECTOMY PARTIAL / LUMPECTOMY Left     Prior to Admission medications   Medication Sig Start Date End Date Taking? Authorizing Provider  acetaminophen (TYLENOL) 325 MG tablet Take 650 mg by mouth 2 (two) times daily.   Yes Historical Provider, MD  amLODipine (NORVASC) 2.5 MG tablet Take 2.5 mg by mouth daily.   Yes Historical Provider, MD  Calcium Citrate-Vitamin D (CITRACAL MAXIMUM) 315-250 MG-UNIT TABS Take 1 tablet by mouth 2 (two) times daily.   Yes Historical Provider, MD  conjugated estrogens (PREMARIN) vaginal cream Place 1 Applicatorful vaginally every Monday, Wednesday, and Friday. Apply pea sized amount to uretheral openings at bedtime on Monday, Wednesday, and Friday.   Yes Historical Provider, MD  Cranberry 250 MG CAPS Take 250 mg by mouth 2 (two) times daily.   Yes Historical Provider, MD  fexofenadine (ALLEGRA) 60 MG tablet Take 60 mg by mouth 2 (two) times daily.   Yes Historical Provider, MD  glimepiride (AMARYL) 2 MG tablet Take 2 mg by mouth 2 (two) times daily.   Yes Historical Provider, MD  guaiFENesin (ROBITUSSIN) 100 MG/5ML liquid Take 200 mg by mouth every 6 (six) hours as needed for cough. Take up to  48 hours.   Yes Historical Provider, MD  senna (SENOKOT) 8.6 MG tablet Take 1 tablet by mouth daily.   Yes Historical Provider, MD  carvedilol (COREG) 6.25 MG tablet Take 1 tablet (6.25 mg total) by mouth 2 (two) times daily with a meal. Patient not taking: Reported on 05/27/2016 01/25/15   Rusty Aus, MD  cefpodoxime (VANTIN) 200 MG tablet Take 1 tablet (200 mg total) by mouth 2 (two) times daily. 05/27/16   Lisa Roca, MD  Influenza vac split quadrivalent PF (FLUARIX) 0.5 ML injection Inject 0.5 mLs into the muscle tomorrow at 10  am. Patient not taking: Reported on 05/27/2016 01/25/15   Rusty Aus, MD  nitrofurantoin (MACRODANTIN) 100 MG capsule Take 1 capsule (100 mg total) by mouth 4 (four) times daily. Patient not taking: Reported on 05/27/2016 10/19/15   Daymon Larsen, MD    No Known Allergies  Family History  Problem Relation Age of Onset  . Hypertension      Social History Social History  Substance Use Topics  . Smoking status: Never Smoker  . Smokeless tobacco: Never Used  . Alcohol use No    Review of Systems  Constitutional: Negative for fever. Eyes: Negative for visual changes. ENT: Negative for sore throat. Cardiovascular: Negative for chest pain. Respiratory: Negative for shortness of breath. Gastrointestinal: Negative for abdominal pain, vomiting and diarrhea. Genitourinary: Negative for dysuria. Musculoskeletal: Negative for back pain. Skin: Negative for rash.  Hematoma and bruising at the right mid forehead and also wearing below both eyes. Neurological: Negative for headache. 10 point Review of Systems otherwise negative ____________________________________________   PHYSICAL EXAM:  VITAL SIGNS: ED Triage Vitals  Enc Vitals Group     BP 05/27/16 0934 (!) 121/57     Pulse Rate 05/27/16 0934 62     Resp 05/27/16 0934 16     Temp 05/27/16 0934 97.6 F (36.4 C)     Temp Source 05/27/16 0934 Oral     SpO2 05/27/16 0934 99 %     Weight 05/27/16 0935 159 lb (72.1 kg)     Height --      Head Circumference --      Peak Flow --      Pain Score --      Pain Loc --      Pain Edu? --      Excl. in [place]? --      Constitutional: Alert and Interactive/cooperative. Well appearing and in no distress. HEENT   Head: Hematoma mid forehead and ecchymosis layering below both eyes.      Eyes: Conjunctivae are normal. PERRL. Normal extraocular movements.      Ears:         Nose: No congestion/rhinnorhea.   Mouth/Throat: Mucous membranes are moist.   Neck: No  stridor. Cardiovascular/Chest: Normal rate, regular rhythm.  No murmurs, rubs, or gallops. Respiratory: Normal respiratory effort without tachypnea nor retractions. Breath sounds are clear and equal bilaterally. No wheezes/rales/rhonchi. Gastrointestinal: Soft. No distention, no guarding, no rebound. Nontender.    Genitourinary/rectal:Deferred Musculoskeletal: Pelvis stable, nontender with active and passive range of motion to both hips, knees, and ankles. No significant lower extremity edema or calf tenderness.   Neurologic: No slurred speech. No facial droop. Equal strength bilaterally. No gross focal deficit on strength testing in the bed.    Skin:  Skin is warm, dry and intact.  Psychiatric: Mood and affect are normal.   ____________________________________________  LABS (pertinent positives/negatives)  Labs Reviewed  BASIC METABOLIC PANEL - Abnormal; Notable for the following:       Result Value   Glucose, Bld 190 (*)    BUN 23 (*)    GFR calc non Af Amer 48 (*)    GFR calc Af Amer 56 (*)    All other components within normal limits  URINALYSIS, COMPLETE (UACMP) WITH MICROSCOPIC - Abnormal; Notable for the following:    Color, Urine YELLOW (*)    APPearance TURBID (*)    Hgb urine dipstick MODERATE (*)    Protein, ur 100 (*)    Nitrite POSITIVE (*)    Leukocytes, UA LARGE (*)    Bacteria, UA MANY (*)    Squamous Epithelial / LPF 0-5 (*)    All other components within normal limits  URINE CULTURE  CBC    ____________________________________________    EKG I, Lisa Roca, MD, the attending physician have personally viewed and interpreted all ECGs.  70 bpm. Normal sinus rhythm. No acute respiratory boxes. Nonspecific ST and T-wave ____________________________________________  RADIOLOGY All Xrays were viewed by me. Imaging interpreted by Radiologist.  CT without contrast:   CLINICAL DATA: Difficulty walking. Fall 3 days ago.  EXAM: CT HEAD WITHOUT  CONTRAST  TECHNIQUE: Contiguous axial images were obtained from the base of the skull through the vertex without intravenous contrast.  COMPARISON: September 07, 202017  FINDINGS: Brain: There is atrophy and chronic small vessel disease changes. No acute intracranial abnormality. Specifically, no hemorrhage, hydrocephalus, mass lesion, acute infarction, or significant intracranial injury.  Vascular: No hyperdense vessel or unexpected calcification.  Skull: No acute calvarial abnormality.  Sinuses/Orbits: Visualized paranasal sinuses and mastoids clear. Orbital soft tissues unremarkable.  Other: Soft tissue swelling in the forehead.  IMPRESSION: No acute intracranial abnormality.  Atrophy, chronic microvascular disease.       Right hip and left hip x-rays:  No acute abnormalities. __________________________________________  PROCEDURES  Procedure(s) performed: None  Critical Care performed: None  ____________________________________________   ED COURSE / ASSESSMENT AND PLAN  Pertinent labs & imaging results that were available during my care of the patient were reviewed by me and considered in my medical decision making (see chart for details).   Ms. Lovegrove was brought in for evaluation of what sounds like possible right hip pain or right leg weakness. Symptoms do not seem to be very reproducible. She did fall out of bed a few days ago, but did not have pain at that point in time and it seems that developed later, however feel he states that she is pretty stoic and does not typically feel much pain and so I will x-ray of both hips. Part of what the daughters describe is dragging the right leg, although I don't appreciate focal weakness on exam, I discussed with them obtaining CT head to rule out signs of stroke. We did discuss that this would be somewhat potentially limited given onset within the past 24 hours.  Laboratory studies are reassuring. Urinalysis is consistent with  nitrites positive UTI. I did discuss with the daughters that I think this is probably the source of her symptoms in terms of trouble walking. She is given a dose of Rocephin here. No evidence for SIRS or sepsis here. I discussed with family and they we could admit for observation overnight with her not being at her baseline ability to walk with the diagnosis of urinary tract infection, however they are understandably pretty resistant to staying in the hospital on a don't think that she necessarily needs to.  They're they're to help her at the assisted living and she is going be discharged home.   CONSULTATIONS:   None   Patient / Family / Caregiver informed of clinical course, medical decision-making process, and agree with plan.   I discussed return precautions, follow-up instructions, and discharge instructions with patient and/or family.   ___________________________________________   FINAL CLINICAL IMPRESSION(S) / ED DIAGNOSES   Final diagnoses:  Urinary tract infection with hematuria, site unspecified  Trouble walking              Note: This dictation was prepared with Dragon dictation. Any transcriptional errors that result from this process are unintentional    Lisa Roca, MD 05/28/16 2317

## 2016-05-27 NOTE — ED Triage Notes (Signed)
Pt came to ED via EMS from [facility]. Pt took a fall 3 days ago. Unsure how pt fell. Pt has significant bruising around bilateral eyes. Not c/o any pain. VS stable.

## 2016-05-27 NOTE — ED Notes (Signed)
Pt transported to xray 

## 2016-05-27 NOTE — Discharge Instructions (Signed)
You're being treated for urinary tract infection with antibiotic called Vantin. Start first dose tomorrow. You were given 20 for her dose of Rocephin antibiotic here in the emergency room.  Return to the emergency department any new or worsening pain, confusion or altered mental status, fever, vomiting, or any other symptoms concerning to you.

## 2016-05-29 LAB — URINE CULTURE

## 2017-09-13 ENCOUNTER — Emergency Department: Payer: Medicare Other

## 2017-09-13 ENCOUNTER — Inpatient Hospital Stay
Admission: EM | Admit: 2017-09-13 | Discharge: 2017-09-18 | DRG: 872 | Disposition: A | Discharge status: Nursing Home (Includes ICF) | Payer: Medicare Other | Attending: Internal Medicine | Admitting: Internal Medicine

## 2017-09-13 ENCOUNTER — Encounter: Payer: Self-pay | Admitting: Emergency Medicine

## 2017-09-13 DIAGNOSIS — F0280 Dementia in other diseases classified elsewhere without behavioral disturbance: Secondary | ICD-10-CM | POA: Diagnosis present

## 2017-09-13 DIAGNOSIS — Z66 Do not resuscitate: Secondary | ICD-10-CM | POA: Diagnosis present

## 2017-09-13 DIAGNOSIS — R627 Adult failure to thrive: Secondary | ICD-10-CM | POA: Diagnosis present

## 2017-09-13 DIAGNOSIS — G9349 Other encephalopathy: Secondary | ICD-10-CM | POA: Diagnosis present

## 2017-09-13 DIAGNOSIS — Z515 Encounter for palliative care: Secondary | ICD-10-CM | POA: Diagnosis not present

## 2017-09-13 DIAGNOSIS — Z8249 Family history of ischemic heart disease and other diseases of the circulatory system: Secondary | ICD-10-CM | POA: Diagnosis not present

## 2017-09-13 DIAGNOSIS — R4182 Altered mental status, unspecified: Secondary | ICD-10-CM

## 2017-09-13 DIAGNOSIS — G9340 Encephalopathy, unspecified: Secondary | ICD-10-CM | POA: Diagnosis not present

## 2017-09-13 DIAGNOSIS — Z79899 Other long term (current) drug therapy: Secondary | ICD-10-CM

## 2017-09-13 DIAGNOSIS — G309 Alzheimer's disease, unspecified: Secondary | ICD-10-CM | POA: Diagnosis present

## 2017-09-13 DIAGNOSIS — N183 Chronic kidney disease, stage 3 (moderate): Secondary | ICD-10-CM | POA: Diagnosis present

## 2017-09-13 DIAGNOSIS — Z1624 Resistance to multiple antibiotics: Secondary | ICD-10-CM | POA: Diagnosis present

## 2017-09-13 DIAGNOSIS — R6520 Severe sepsis without septic shock: Secondary | ICD-10-CM | POA: Diagnosis present

## 2017-09-13 DIAGNOSIS — I481 Persistent atrial fibrillation: Secondary | ICD-10-CM | POA: Diagnosis present

## 2017-09-13 DIAGNOSIS — I4891 Unspecified atrial fibrillation: Secondary | ICD-10-CM | POA: Diagnosis not present

## 2017-09-13 DIAGNOSIS — R05 Cough: Secondary | ICD-10-CM

## 2017-09-13 DIAGNOSIS — E1122 Type 2 diabetes mellitus with diabetic chronic kidney disease: Secondary | ICD-10-CM | POA: Diagnosis present

## 2017-09-13 DIAGNOSIS — Z853 Personal history of malignant neoplasm of breast: Secondary | ICD-10-CM

## 2017-09-13 DIAGNOSIS — I129 Hypertensive chronic kidney disease with stage 1 through stage 4 chronic kidney disease, or unspecified chronic kidney disease: Secondary | ICD-10-CM | POA: Diagnosis present

## 2017-09-13 DIAGNOSIS — N390 Urinary tract infection, site not specified: Secondary | ICD-10-CM | POA: Diagnosis present

## 2017-09-13 DIAGNOSIS — E785 Hyperlipidemia, unspecified: Secondary | ICD-10-CM | POA: Diagnosis present

## 2017-09-13 DIAGNOSIS — A419 Sepsis, unspecified organism: Principal | ICD-10-CM | POA: Diagnosis present

## 2017-09-13 DIAGNOSIS — I10 Essential (primary) hypertension: Secondary | ICD-10-CM | POA: Diagnosis present

## 2017-09-13 DIAGNOSIS — Z7189 Other specified counseling: Secondary | ICD-10-CM | POA: Diagnosis not present

## 2017-09-13 DIAGNOSIS — E119 Type 2 diabetes mellitus without complications: Secondary | ICD-10-CM

## 2017-09-13 DIAGNOSIS — R059 Cough, unspecified: Secondary | ICD-10-CM

## 2017-09-13 LAB — LACTIC ACID, PLASMA
LACTIC ACID, VENOUS: 2.5 mmol/L — AB (ref 0.5–1.9)
LACTIC ACID, VENOUS: 2.3 mmol/L — AB (ref 0.5–1.9)

## 2017-09-13 LAB — COMPREHENSIVE METABOLIC PANEL
ALBUMIN: 4.1 g/dL (ref 3.5–5.0)
ALK PHOS: 69 U/L (ref 38–126)
ALT: 17 U/L (ref 14–54)
ANION GAP: 11 (ref 5–15)
AST: 27 U/L (ref 15–41)
BILIRUBIN TOTAL: 0.8 mg/dL (ref 0.3–1.2)
BUN: 24 mg/dL — AB (ref 6–20)
CALCIUM: 10.2 mg/dL (ref 8.9–10.3)
CO2: 26 mmol/L (ref 22–32)
CREATININE: 0.91 mg/dL (ref 0.44–1.00)
Chloride: 104 mmol/L (ref 101–111)
GFR calc Af Amer: 59 mL/min — ABNORMAL LOW (ref >60)
GFR calc non Af Amer: 51 mL/min — ABNORMAL LOW (ref >60)
Glucose, Bld: 185 mg/dL — ABNORMAL HIGH (ref 65–99)
Potassium: 3.5 mmol/L (ref 3.5–5.1)
Sodium: 141 mmol/L (ref 135–145)
TOTAL PROTEIN: 7.6 g/dL (ref 6.5–8.1)

## 2017-09-13 LAB — URINALYSIS, COMPLETE (UACMP) WITH MICROSCOPIC
BILIRUBIN URINE: NEGATIVE
Glucose, UA: NEGATIVE mg/dL
Ketones, ur: 5 mg/dL — AB
Nitrite: POSITIVE — AB
PROTEIN: 100 mg/dL — AB
SPECIFIC GRAVITY, URINE: 1.02 (ref 1.005–1.030)
Squamous Epithelial / LPF: NONE SEEN
pH: 5 (ref 5.0–8.0)

## 2017-09-13 LAB — CBC WITH DIFFERENTIAL/PLATELET
BASOS PCT: 1 %
Basophils Absolute: 0.1 10*3/uL (ref 0–0.1)
EOS ABS: 0.1 10*3/uL (ref 0–0.7)
EOS PCT: 1 %
HCT: 46 % (ref 35.0–47.0)
Hemoglobin: 14.9 g/dL (ref 12.0–16.0)
Lymphocytes Relative: 23 %
Lymphs Abs: 2.6 10*3/uL (ref 1.0–3.6)
MCH: 30 pg (ref 26.0–34.0)
MCHC: 32.4 g/dL (ref 32.0–36.0)
MCV: 92.5 fL (ref 80.0–100.0)
MONO ABS: 0.6 10*3/uL (ref 0.2–0.9)
MONOS PCT: 5 %
Neutro Abs: 8 10*3/uL — ABNORMAL HIGH (ref 1.4–6.5)
Neutrophils Relative %: 70 %
Platelets: 232 10*3/uL (ref 150–440)
RBC: 4.98 MIL/uL (ref 3.80–5.20)
RDW: 15.2 % — AB (ref 11.5–14.5)
WBC: 11.4 10*3/uL — ABNORMAL HIGH (ref 3.6–11.0)

## 2017-09-13 LAB — TROPONIN I: Troponin I: <0.03 ng/mL (ref <0.03)

## 2017-09-13 MED ORDER — INSULIN ASPART 100 UNIT/ML ~~LOC~~ SOLN
0.0000 [IU] | Freq: Three times a day (TID) | SUBCUTANEOUS | Status: DC
  Administered 2017-09-14: 2 [IU] via SUBCUTANEOUS
  Administered 2017-09-14 – 2017-09-16 (×4): 1 [IU] via SUBCUTANEOUS
  Administered 2017-09-16: 2 [IU] via SUBCUTANEOUS
  Administered 2017-09-17: 1 [IU] via SUBCUTANEOUS
  Administered 2017-09-17: 2 [IU] via SUBCUTANEOUS
  Administered 2017-09-18 (×2): 1 [IU] via SUBCUTANEOUS
  Filled 2017-09-13 (×10): qty 1

## 2017-09-13 MED ORDER — ACETAMINOPHEN 325 MG PO TABS
650.0000 mg | ORAL_TABLET | Freq: Four times a day (QID) | ORAL | Status: DC | PRN
  Administered 2017-09-17 (×2): 650 mg via ORAL
  Filled 2017-09-13: qty 2

## 2017-09-13 MED ORDER — ONDANSETRON HCL 4 MG PO TABS: 4.0000 mg | ORAL_TABLET | Freq: Four times a day (QID) | ORAL | Status: DC | PRN

## 2017-09-13 MED ORDER — SODIUM CHLORIDE 0.9 % IV SOLN
INTRAVENOUS | Status: AC
  Administered 2017-09-14: 01:00:00 via INTRAVENOUS

## 2017-09-13 MED ORDER — ACETAMINOPHEN 650 MG RE SUPP
650.0000 mg | Freq: Four times a day (QID) | RECTAL | Status: DC | PRN
Start: 2017-09-13 — End: 2017-09-18

## 2017-09-13 MED ORDER — PIPERACILLIN-TAZOBACTAM 3.375 G IVPB 30 MIN
3.3750 g | Freq: Once | INTRAVENOUS | Status: AC
  Administered 2017-09-13: 3.375 g via INTRAVENOUS
  Filled 2017-09-13: qty 50

## 2017-09-13 MED ORDER — SODIUM CHLORIDE 0.9 % IV SOLN
Freq: Once | INTRAVENOUS | Status: AC
  Administered 2017-09-13: 21:00:00 via INTRAVENOUS

## 2017-09-13 MED ORDER — ONDANSETRON HCL 4 MG/2ML IJ SOLN: 4.0000 mg | Freq: Four times a day (QID) | INTRAMUSCULAR | Status: DC | PRN

## 2017-09-13 MED ORDER — ENOXAPARIN SODIUM 40 MG/0.4ML ~~LOC~~ SOLN: 40.0000 mg | SUBCUTANEOUS | Status: DC

## 2017-09-13 MED ORDER — INSULIN ASPART 100 UNIT/ML ~~LOC~~ SOLN: 0.0000 [IU] | Freq: Every day | SUBCUTANEOUS | Status: DC

## 2017-09-13 MED ORDER — SODIUM CHLORIDE 0.9 % IV SOLN
1.0000 g | INTRAVENOUS | Status: DC
  Administered 2017-09-13 – 2017-09-15 (×3): 1 g via INTRAVENOUS
  Filled 2017-09-13 (×4): qty 10

## 2017-09-13 MED ORDER — LATANOPROST 0.005 % OP SOLN
1.0000 [drp] | Freq: Every day | OPHTHALMIC | Status: DC
  Administered 2017-09-14 – 2017-09-17 (×4): 1 [drp] via OPHTHALMIC
  Filled 2017-09-13: qty 2.5

## 2017-09-13 NOTE — ED Notes (Signed)
Patient transported to room 244 by this EDT. 

## 2017-09-13 NOTE — H&P (Signed)
[place] [facility] [place] NAME: Cynthia Lynch    MR#:  [id number]  DATE OF BIRTH:  1917/05/17  DATE OF ADMISSION:  09/13/2017  PRIMARY CARE PHYSICIAN: Rusty Aus, MD   REQUESTING/REFERRING PHYSICIAN: Cinda Quest, MD  CHIEF COMPLAINT:   Chief Complaint  Patient presents with  . Altered Mental Status    HISTORY OF PRESENT ILLNESS:  Cynthia Lynch  is a 84 y.o. female who presents with lethargy, altered mental status.  Patient was at the facility where she resides at dinnertime and was found slumped over and not really responding much.  She does not contribute information to HPI today.  She was brought to the ED for evaluation.  Here she met sepsis criteria and was found to have a UTI.  Hospitalist will call for admission  PAST MEDICAL HISTORY:   Past Medical History:  Diagnosis Date  . Alzheimer's dementia   . Cancer (HCC)    breast, ovarian  . Chronic kidney disease (CKD), stage III (moderate) (HCC)   . Diabetes mellitus without complication ([place])   . Hematoma    right forehead  . Hyperlipidemia   . Hypertension   . Subarchnoid hemorrhage-brief coma      PAST SURGICAL HISTORY:   Past Surgical History:  Procedure Laterality Date  . ABDOMINAL HYSTERECTOMY     daughter not sure, pt unable to tell  . EYE SURGERY    . MASTECTOMY PARTIAL / LUMPECTOMY Left      SOCIAL HISTORY:   Social History   Tobacco Use  . Smoking status: Never Smoker  . Smokeless tobacco: Never Used  Substance Use Topics  . Alcohol use: No     FAMILY HISTORY:   Family History  Problem Relation Age of Onset  . Hypertension Unknown      DRUG ALLERGIES:  No Known Allergies  MEDICATIONS AT HOME:   Prior to Admission medications   Medication Sig Start Date End Date Taking? Authorizing Provider  acetaminophen (TYLENOL) 325 MG tablet Take 650 mg by mouth 2 (two) times daily.   Yes [provider]  amLODipine (NORVASC) 2.5 MG  tablet Take 2.5 mg by mouth daily.   Yes [provider]  Calcium Citrate-Vitamin D (CITRACAL MAXIMUM) 315-250 MG-UNIT TABS Take 1 tablet by mouth 2 (two) times daily.   Yes [provider]  conjugated estrogens (PREMARIN) vaginal cream Place 1 Applicatorful vaginally every Monday, Wednesday, and Friday. Apply pea sized amount to uretheral openings at bedtime on Monday, Wednesday, and Friday.   Yes [provider]  Cranberry 250 MG CAPS Take 250 mg by mouth 2 (two) times daily.   Yes [provider]  dextromethorphan-guaiFENesin (MUCINEX DM) 30-600 MG 12hr tablet Take 1 tablet by mouth 2 (two) times daily as needed for cough.   Yes [provider]  furosemide (LASIX) 20 MG tablet Take 1 tablet by mouth daily as needed for fluid or edema.  10/12/16  Yes [provider]  glimepiride (AMARYL) 2 MG tablet Take 2 mg by mouth 2 (two) times daily.   Yes [provider]  guaiFENesin (ROBITUSSIN) 100 MG/5ML liquid Take 200 mg by mouth every 6 (six) hours as needed for cough. Take up to 48 hours.   Yes [provider]  hydrocortisone cream 1 % Apply 1 application topically 2 (two) times daily as needed (dry skin).    Yes [provider]  latanoprost (XALATAN) 0.005 % ophthalmic solution Place 1 drop into  both eyes at bedtime.    Yes [provider]  loratadine (CLARITIN) 10 MG tablet Take 1 tablet by mouth every morning.   Yes [provider]  senna (SENOKOT) 8.6 MG tablet Take 1 tablet by mouth daily.   Yes [provider]  carvedilol (COREG) 6.25 MG tablet Take 1 tablet (6.25 mg total) by mouth 2 (two) times daily with a meal. Patient not taking: Reported on 05/27/2016 01/25/15   Rusty Aus, MD    REVIEW OF SYSTEMS:  Review of Systems  Unable to perform ROS: Acuity of condition     VITAL SIGNS:   Vitals:   09/13/17 1912 09/13/17 2123 09/13/17 2130  BP: (!) 129/99  94/77  Pulse: (!) 148  (!)  139  Resp: 18  (!) 22  Temp: (!) 97.5 F (36.4 C)    TempSrc: Axillary    SpO2: 98%  95%  Weight:  57.3 kg (126 lb 6.4 oz)    Wt Readings from Last 3 Encounters:  09/13/17 57.3 kg (126 lb 6.4 oz)  05/27/16 72.1 kg (159 lb)  05/25/16 72.1 kg (159 lb)    PHYSICAL EXAMINATION:  Physical Exam  Vitals reviewed. Constitutional: She appears well-developed and well-nourished. No distress.  HENT:  Head: Normocephalic and atraumatic.  Dry mucous membranes  Eyes: Pupils are equal, round, and reactive to light. Conjunctivae and EOM are normal. No scleral icterus.  Neck: Normal range of motion. Neck supple. No JVD present. No thyromegaly present.  Cardiovascular: Normal rate, regular rhythm and intact distal pulses. Exam reveals no gallop and no friction rub.  No murmur heard. Respiratory: Effort normal and breath sounds normal. No respiratory distress. She has no wheezes. She has no rales.  GI: Soft. Bowel sounds are normal. She exhibits no distension. There is no tenderness.  Musculoskeletal: Normal range of motion. She exhibits no edema.  No arthritis, no gout  Lymphadenopathy:    She has no cervical adenopathy.  Neurological:  Unable to assess due to patient condition  Skin: Skin is warm and dry. No rash noted. No erythema.  Psychiatric:  Unable to assess due to patient condition    LABORATORY PANEL:   CBC Recent Labs  Lab 09/13/17 1915  WBC 11.4*  HGB 14.9  HCT 46.0  PLT 232   ------------------------------------------------------------------------------------------------------------------  Chemistries  Recent Labs  Lab 09/13/17 1915  NA 141  K 3.5  CL 104  CO2 26  GLUCOSE 185*  BUN 24*  CREATININE 0.91  CALCIUM 10.2  AST 27  ALT 17  ALKPHOS 69  BILITOT 0.8   ------------------------------------------------------------------------------------------------------------------  Cardiac Enzymes Recent Labs  Lab 09/13/17 1915  TROPONINI <0.03    ------------------------------------------------------------------------------------------------------------------  RADIOLOGY:  Ct Head Wo Contrast  Result Date: 09/13/2017 CLINICAL DATA:  Unresponsive, RIGHT-sided facial droop. EXAM: CT HEAD WITHOUT CONTRAST TECHNIQUE: Contiguous axial images were obtained from the base of the skull through the vertex without intravenous contrast. COMPARISON:  Head CT dated 05/27/2016 FINDINGS: Brain: Age related parenchymal atrophy with commensurate dilatation of the ventricles and sulci. Chronic small vessel ischemic changes again noted within the bilateral periventricular and subcortical white matter regions. No mass, hemorrhage, edema or other evidence of acute parenchymal abnormality. No extra-axial hemorrhage. Vascular: There are chronic calcified atherosclerotic changes of the large vessels at the skull base. No unexpected hyperdense vessel. Skull: Normal. Negative for fracture or focal lesion. Sinuses/Orbits: No acute finding. Other: None. IMPRESSION: 1. No acute findings.  No intracranial mass, hemorrhage or edema. 2. Atrophy and  chronic small vessel ischemic changes in the white matter. Electronically Signed   By: Franki Cabot M.D.   On: 09/13/2017 20:01   Dg Chest Portable 1 View  Result Date: 09/13/2017 CLINICAL DATA:  Weakness and atrial fibrillation. EXAM: PORTABLE CHEST 1 VIEW COMPARISON:  A 12/16/2014 and prior chest radiograph FINDINGS: Cardiomegaly and pulmonary vascular congestion noted. Mild bibasilar opacities are noted, probably atelectasis. A probable small RIGHT pleural effusion is noted. There is no evidence of pneumothorax. No acute bony abnormalities are present. IMPRESSION: 1. Cardiomegaly with pulmonary vascular congestion. 2. Probable small RIGHT pleural effusion. 3. Mild bibasilar opacities-favor atelectasis. Electronically Signed   By: Margarette Canada M.D.   On: 09/13/2017 19:43    EKG:   Orders placed or performed during the hospital  encounter of 09/13/17  . ED EKG  . ED EKG    IMPRESSION AND PLAN:  Principal Problem:   Severe sepsis ([place]) -tachycardic, increased respiratory rate, elevated lactic acid, encephalopathy.  UTI as a source.  IV antibiotics started, culture sent, IV fluids ordered and trend lactate until within normal limits, blood pressure stable. Active Problems:   UTI (urinary tract infection) -IV antibiotics as above.  Patient historically has had some multidrug-resistant organisms, but per sensitivities she has always had bacteria susceptible to Rocephin, this antibiotic has been ordered   Acute encephalopathy -due to her UTI, this is superimposed on baseline dementia, but the patient has had a significant decline in mental status today.  Monitor for improvement with resolution of her infection   HTN (hypertension) -stable, hold antihypertensives for now as the patient's blood pressure is normotensive   Diabetes (HCC) -sliding scale insulin with corresponding glucose checks   HLD (hyperlipidemia) -Home dose antilipid  Chart review performed and case discussed with ED provider. Labs, imaging and/or ECG reviewed by provider and discussed with patient/family. Management plans discussed with the patient and/or family.  DVT PROPHYLAXIS: SubQ lovenox  GI PROPHYLAXIS: None  ADMISSION STATUS: Inpatient  CODE STATUS: Full Code Status History    Date Active Date Inactive Code Status Order ID Comments User Context   01/23/2015 1028 01/25/2015 1552 Full Code [id number]  Dustin Flock, MD ED      TOTAL TIME TAKING CARE OF THIS PATIENT: 45 minutes.   Jannifer Franklin, Mela Perham Berne 09/13/2017, 10:18 PM  [facility]  Office  [phone]  CC: Primary care physician; Rusty Aus, MD  Note:  This document was prepared using Dragon voice recognition software and may include unintentional dictation errors.

## 2017-09-13 NOTE — Progress Notes (Signed)
ANTIBIOTIC CONSULT NOTE - INITIAL  Pharmacy Consult for Ceftriaxone  Indication: UTI  No Known Allergies  Patient Measurements: Weight: 126 lb 6.4 oz (57.3 kg) Adjusted Body Weight:   Vital Signs: Temp: 97.5 F (36.4 C) (04/18 1912) Temp Source: Axillary (04/18 1912) BP: 94/77 (04/18 2130) Pulse Rate: 139 (04/18 2130) Intake/Output from previous day: No intake/output data recorded. Intake/Output from this shift: Total I/O In: 200 [I.V.:100; IV Piggyback:100] Out: -   Labs: Recent Labs    09/13/17 1915  WBC 11.4*  HGB 14.9  PLT 232  CREATININE 0.91   CrCl cannot be calculated (Unknown ideal weight.). No results for input(s): VANCOTROUGH, VANCOPEAK, VANCORANDOM, GENTTROUGH, GENTPEAK, GENTRANDOM, TOBRATROUGH, TOBRAPEAK, TOBRARND, AMIKACINPEAK, AMIKACINTROU, AMIKACIN in the last 72 hours.   Microbiology: No results found for this or any previous visit (from the past 720 hour(s)).  Medical History: Past Medical History:  Diagnosis Date  . Alzheimer's dementia   . Cancer (HCC)    breast, ovarian  . Chronic kidney disease (CKD), stage III (moderate) (HCC)   . Diabetes mellitus without complication ([place])   . Hematoma    right forehead  . Hyperlipidemia   . Hypertension   . Subarchnoid hemorrhage-brief coma     Medications:   (Not in a hospital admission) Assessment:   Goal of Therapy:  resolution of infection  Plan:  Expected duration 7 days with resolution of temperature and/or normalization of WBC   Ceftriaxone 1 gm IV Q24H ordered to start on 4/18.   Avacyn Kloosterman D 09/13/2017,10:27 PM

## 2017-09-13 NOTE — ED Triage Notes (Signed)
Pt was brought in from [facility] in [place]. Pt was supposedly eating dinner when she slumped over unresponsive per [facility]. Pt became responsive with [facility]. Per [facility] pt was A-fib with RVR. Pt in the 140's per EMS. Pt awake on arrival with right sided facial droop, unknown if this is new or not. Pt is fidgeting at this time.

## 2017-09-13 NOTE — ED Notes (Signed)
Mickel Baas RN, aware of bed assigned

## 2017-09-13 NOTE — ED Provider Notes (Signed)
[facility] Emergency Department Provider Note   ____________________________________________   First MD Initiated Contact with Patient 09/13/17 1901     (approximate)  I have reviewed the triage vital signs and the nursing notes.   HISTORY  Chief Complaint Altered Mental Status  History limited by altered mental status HPI Cynthia Lynch is a 84 y.o. female who comes from [facility] assisted living.  Patient apparently collapsed at dinner.  This happened around 5:00 per EMS.  Patient has a history of dementia.  EMS reports patient seems to be leaning to the left more than anything else is little bit of left-sided facial droop.  Patient is moving all extremities equally and well and will not follow commands.   Past Medical History:  Diagnosis Date  . Alzheimer's dementia   . Cancer (HCC)    breast, ovarian  . Chronic kidney disease (CKD), stage III (moderate) (HCC)   . Diabetes mellitus without complication ([place])   . Hematoma    right forehead  . Hyperlipidemia   . Hypertension   . Subarchnoid hemorrhage-brief coma     Patient Active Problem List   Diagnosis Date Noted  . Syncope and collapse 01/23/2015    Past Surgical History:  Procedure Laterality Date  . ABDOMINAL HYSTERECTOMY     daughter not sure, pt unable to tell  . EYE SURGERY    . MASTECTOMY PARTIAL / LUMPECTOMY Left     Prior to Admission medications   Medication Sig Start Date End Date Taking? Authorizing Provider  acetaminophen (TYLENOL) 325 MG tablet Take 650 mg by mouth 2 (two) times daily.   Yes [provider]  amLODipine (NORVASC) 2.5 MG tablet Take 2.5 mg by mouth daily.   Yes [provider]  Calcium Citrate-Vitamin D (CITRACAL MAXIMUM) 315-250 MG-UNIT TABS Take 1 tablet by mouth 2 (two) times daily.   Yes [provider]  conjugated estrogens (PREMARIN) vaginal cream Place 1 Applicatorful vaginally every Monday, Wednesday, and Friday.  Apply pea sized amount to uretheral openings at bedtime on Monday, Wednesday, and Friday.   Yes [provider]  Cranberry 250 MG CAPS Take 250 mg by mouth 2 (two) times daily.   Yes [provider]  dextromethorphan-guaiFENesin (MUCINEX DM) 30-600 MG 12hr tablet Take 1 tablet by mouth 2 (two) times daily as needed for cough.   Yes [provider]  furosemide (LASIX) 20 MG tablet Take 1 tablet by mouth daily as needed for fluid or edema.  10/12/16  Yes [provider]  glimepiride (AMARYL) 2 MG tablet Take 2 mg by mouth 2 (two) times daily.   Yes [provider]  guaiFENesin (ROBITUSSIN) 100 MG/5ML liquid Take 200 mg by mouth every 6 (six) hours as needed for cough. Take up to 48 hours.   Yes [provider]  hydrocortisone cream 1 % Apply 1 application topically 2 (two) times daily as needed (dry skin).    Yes [provider]  latanoprost (XALATAN) 0.005 % ophthalmic solution Place 1 drop into both eyes at bedtime.    Yes [provider]  loratadine (CLARITIN) 10 MG tablet Take 1 tablet by mouth every morning.   Yes [provider]  senna (SENOKOT) 8.6 MG tablet Take 1 tablet by mouth daily.   Yes [provider]  carvedilol (COREG) 6.25 MG tablet Take 1 tablet (6.25 mg total) by mouth 2 (two) times daily with a meal. Patient not taking: Reported on 05/27/2016 01/25/15   Sabra Heck,  Christean Grief, MD    Allergies Patient has no known allergies.  Family History  Problem Relation Age of Onset  . Hypertension Unknown     Social History Social History   Tobacco Use  . Smoking status: Never Smoker  . Smokeless tobacco: Never Used  Substance Use Topics  . Alcohol use: No  . Drug use: No    Review of Systems Unable to obtain ____________________________________________   PHYSICAL EXAM:  VITAL SIGNS: ED Triage Vitals  Enc Vitals Group     BP      Pulse      Resp      Temp      Temp src      SpO2       Weight      Height      Head Circumference      Peak Flow      Pain Score      Pain Loc      Pain Edu?      Excl. in [place]?     Constitutional: Alert  Well appearing and in no acute distress. Eyes: Conjunctivae are normal though difficult to see because patient is not opening eyes all the way.  Head: Atraumatic. Nose: No congestion/rhinnorhea. Mouth/Throat: Mucous membranes are moist.  Oropharynx non-erythematous. Neck: No stridor.   Cardiovascular: Normal rate, regular rhythm. Grossly normal heart sounds.  Good peripheral circulation. Respiratory: Normal respiratory effort.  No retractions. Lungs CTAB. Gastrointestinal: Soft and nontender. No distention. No abdominal bruits. No CVA tenderness. Musculoskeletal: No lower extremity tenderness bilateral edema right greater than left Neurologic: Patient is not following commands is using using both arms normally not moving either leg much Skin:  Skin is warm, dry and intact. No rash noted.   ____________________________________________   LABS (all labs ordered are listed, but only abnormal results are displayed)  Labs Reviewed  COMPREHENSIVE METABOLIC PANEL - Abnormal; Notable for the following components:      Result Value   Glucose, Bld 185 (*)    BUN 24 (*)    GFR calc non Af Amer 51 (*)    GFR calc Af Amer 59 (*)    All other components within normal limits  CBC WITH DIFFERENTIAL/PLATELET - Abnormal; Notable for the following components:   WBC 11.4 (*)    RDW 15.2 (*)    Neutro Abs 8.0 (*)    All other components within normal limits  LACTIC ACID, PLASMA - Abnormal; Notable for the following components:   Lactic Acid, Venous 2.5 (*)    All other components within normal limits  CULTURE, BLOOD (ROUTINE X 2)  CULTURE, BLOOD (ROUTINE X 2)  TROPONIN I  LACTIC ACID, PLASMA  URINALYSIS, COMPLETE (UACMP) WITH MICROSCOPIC  TSH   ____________________________________________  EKG  EKG read and interpreted by me shows  atrial fibrillation with rapid ventricular rate left axis no ST-T segment elevation ____________________________________________  RADIOLOGY  ED MD interpretation: Chest x-ray shows bibasilar densities which are likely atelectasis but could potentially be pneumonia.  Official radiology report(s): Ct Head Wo Contrast  Result Date: 09/13/2017 CLINICAL DATA:  Unresponsive, RIGHT-sided facial droop. EXAM: CT HEAD WITHOUT CONTRAST TECHNIQUE: Contiguous axial images were obtained from the base of the skull through the vertex without intravenous contrast. COMPARISON:  Head CT dated 05/27/2016 FINDINGS: Brain: Age related parenchymal atrophy with commensurate dilatation of the ventricles and sulci. Chronic small vessel ischemic changes again noted within the bilateral periventricular and subcortical white matter regions. No mass, hemorrhage,  edema or other evidence of acute parenchymal abnormality. No extra-axial hemorrhage. Vascular: There are chronic calcified atherosclerotic changes of the large vessels at the skull base. No unexpected hyperdense vessel. Skull: Normal. Negative for fracture or focal lesion. Sinuses/Orbits: No acute finding. Other: None. IMPRESSION: 1. No acute findings.  No intracranial mass, hemorrhage or edema. 2. Atrophy and chronic small vessel ischemic changes in the white matter. Electronically Signed   By: Franki Cabot M.D.   On: 09/13/2017 20:01   Dg Chest Portable 1 View  Result Date: 09/13/2017 CLINICAL DATA:  Weakness and atrial fibrillation. EXAM: PORTABLE CHEST 1 VIEW COMPARISON:  A 12/16/2014 and prior chest radiograph FINDINGS: Cardiomegaly and pulmonary vascular congestion noted. Mild bibasilar opacities are noted, probably atelectasis. A probable small RIGHT pleural effusion is noted. There is no evidence of pneumothorax. No acute bony abnormalities are present. IMPRESSION: 1. Cardiomegaly with pulmonary vascular congestion. 2. Probable small RIGHT pleural effusion. 3. Mild  bibasilar opacities-favor atelectasis. Electronically Signed   By: Margarette Canada M.D.   On: 09/13/2017 19:43    ____________________________________________   PROCEDURES  Procedure(s) performed:   Procedures  Critical Care performed:   ____________________________________________   INITIAL IMPRESSION / ASSESSMENT AND PLAN / ED COURSE  Patient with A. fib and rapid ventricular response no fever but a little white count and possible pneumonia on chest x-ray and new on CT scan.  Patient is fidgeting too much to do an MRI.  Her lactic acid is elevated I will treat her for possible sepsis.         ____________________________________________   FINAL CLINICAL IMPRESSION(S) / ED DIAGNOSES  Final diagnoses:  Altered mental status, unspecified altered mental status type  Atrial fibrillation, unspecified type [facility])  Possible sepsis   ED Discharge Orders    None       Note:  This document was prepared using Dragon voice recognition software and may include unintentional dictation errors.    Nena Polio, MD 09/13/17 2124

## 2017-09-14 LAB — CBC
HCT: 40.6 % (ref 35.0–47.0)
Hemoglobin: 13.1 g/dL (ref 12.0–16.0)
MCH: 29.9 pg (ref 26.0–34.0)
MCHC: 32.2 g/dL (ref 32.0–36.0)
MCV: 92.8 fL (ref 80.0–100.0)
PLATELETS: 183 10*3/uL (ref 150–440)
RBC: 4.37 MIL/uL (ref 3.80–5.20)
RDW: 15.1 % — AB (ref 11.5–14.5)
WBC: 8.2 10*3/uL (ref 3.6–11.0)

## 2017-09-14 LAB — BASIC METABOLIC PANEL
Anion gap: 10 (ref 5–15)
BUN: 19 mg/dL (ref 6–20)
CO2: 22 mmol/L (ref 22–32)
CREATININE: 0.84 mg/dL (ref 0.44–1.00)
Calcium: 8.7 mg/dL — ABNORMAL LOW (ref 8.9–10.3)
Chloride: 110 mmol/L (ref 101–111)
GFR calc Af Amer: >60 mL/min (ref >60)
GFR, EST NON AFRICAN AMERICAN: 56 mL/min — AB (ref >60)
GLUCOSE: 118 mg/dL — AB (ref 65–99)
Potassium: 3.6 mmol/L (ref 3.5–5.1)
SODIUM: 142 mmol/L (ref 135–145)

## 2017-09-14 LAB — TSH: TSH: 2.788 u[IU]/mL (ref 0.350–4.500)

## 2017-09-14 LAB — MRSA PCR SCREENING: MRSA by PCR: NEGATIVE

## 2017-09-14 LAB — GLUCOSE, CAPILLARY
GLUCOSE-CAPILLARY: 126 mg/dL — AB (ref 65–99)
GLUCOSE-CAPILLARY: 79 mg/dL (ref 65–99)
GLUCOSE-CAPILLARY: 147 mg/dL — AB (ref 65–99)
GLUCOSE-CAPILLARY: 130 mg/dL — AB (ref 65–99)

## 2017-09-14 MED ORDER — ENOXAPARIN SODIUM 30 MG/0.3ML ~~LOC~~ SOLN
30.0000 mg | SUBCUTANEOUS | Status: DC
  Administered 2017-09-14 – 2017-09-17 (×4): 30 mg via SUBCUTANEOUS
  Filled 2017-09-14 (×4): qty 0.3

## 2017-09-14 MED ORDER — FUROSEMIDE 10 MG/ML IJ SOLN
20.0000 mg | Freq: Two times a day (BID) | INTRAMUSCULAR | Status: DC
  Administered 2017-09-15 (×3): 20 mg via INTRAVENOUS
  Filled 2017-09-14 (×2): qty 2

## 2017-09-14 MED ORDER — SODIUM CHLORIDE 0.9 % IV SOLN
INTRAVENOUS | Status: DC
  Administered 2017-09-14: 18:00:00 via INTRAVENOUS

## 2017-09-14 MED ORDER — LEVALBUTEROL HCL 0.63 MG/3ML IN NEBU
0.6300 mg | INHALATION_SOLUTION | Freq: Four times a day (QID) | RESPIRATORY_TRACT | Status: DC | PRN
Start: 2017-09-14 — End: 2017-09-18
  Administered 2017-09-15: 0.63 mg via RESPIRATORY_TRACT
  Filled 2017-09-14: qty 3

## 2017-09-14 MED ORDER — METOPROLOL TARTRATE 25 MG PO TABS
25.0000 mg | ORAL_TABLET | Freq: Two times a day (BID) | ORAL | Status: DC
  Administered 2017-09-14 – 2017-09-17 (×8): 25 mg via ORAL
  Filled 2017-09-14 (×9): qty 1

## 2017-09-14 MED ORDER — SODIUM CHLORIDE 0.9 % IV BOLUS
500.0000 mL | Freq: Once | INTRAVENOUS | Status: AC
  Administered 2017-09-14: 500 mL via INTRAVENOUS

## 2017-09-14 MED ORDER — METOPROLOL TARTRATE 5 MG/5ML IV SOLN: 5.0000 mg | INTRAVENOUS | Status: DC | PRN

## 2017-09-14 NOTE — Progress Notes (Signed)
Lovenox changed to 30 mg Daily for BMI <40 and CrCl <30.

## 2017-09-14 NOTE — Progress Notes (Addendum)
[place] at [place] NAME: Cynthia Lynch    MR#:  [id number]  DATE OF BIRTH:  09-09-1916  SUBJECTIVE:  CHIEF COMPLAINT: Patient is altered and at baseline she is demented.  No family members at bedside  REVIEW OF SYSTEMS:  Review of system unobtainable as the patient is demented and altered  DRUG ALLERGIES:  No Known Allergies  VITALS:  Blood pressure 136/81, pulse 83, temperature 98.4 F (36.9 C), temperature source Oral, resp. rate 18, height 5\' 3"  (1.6 m), weight 57.9 kg (127 lb 10.3 oz), SpO2 97 %.  PHYSICAL EXAMINATION:  GENERAL:  84 y.o.-year-old patient lying in the bed with no acute distress.  EYES: Pupils equal, round, reactive to light and accommodation. No scleral icterus. Extraocular muscles intact.  HEENT: Head atraumatic, normocephalic. Oropharynx and nasopharynx clear.  NECK:  Supple, no jugular venous distention. No thyroid enlargement, no tenderness.  LUNGS: Normal breath sounds bilaterally, no wheezing, rales,rhonchi or crepitation. No use of accessory muscles of respiration.  CARDIOVASCULAR: S1, S2 normal. No murmurs, rubs, or gallops.  ABDOMEN: Soft, nontender, nondistended. Bowel sounds present.  EXTREMITIES: No pedal edema, cyanosis, or clubbing.  NEUROLOGIC: Delirious but arousable  pSYCHIATRIC: The patient is chronically demented and currently disoriented SKIN: No obvious rash, lesion, or ulcer.    LABORATORY PANEL:   CBC Recent Labs  Lab 09/14/17 0934  WBC 8.2  HGB 13.1  HCT 40.6  PLT 183   ------------------------------------------------------------------------------------------------------------------  Chemistries  Recent Labs  Lab 09/13/17 1915 09/14/17 0934  NA 141 142  K 3.5 3.6  CL 104 110  CO2 26 22  GLUCOSE 185* 118*  BUN 24* 19  CREATININE 0.91 0.84  CALCIUM 10.2 8.7*  AST 27  --   ALT 17  --   ALKPHOS 69  --   BILITOT 0.8  --     ------------------------------------------------------------------------------------------------------------------  Cardiac Enzymes Recent Labs  Lab 09/13/17 1915  TROPONINI <0.03   ------------------------------------------------------------------------------------------------------------------  RADIOLOGY:  Ct Head Wo Contrast  Result Date: 09/13/2017 CLINICAL DATA:  Unresponsive, RIGHT-sided facial droop. EXAM: CT HEAD WITHOUT CONTRAST TECHNIQUE: Contiguous axial images were obtained from the base of the skull through the vertex without intravenous contrast. COMPARISON:  Head CT dated 05/27/2016 FINDINGS: Brain: Age related parenchymal atrophy with commensurate dilatation of the ventricles and sulci. Chronic small vessel ischemic changes again noted within the bilateral periventricular and subcortical white matter regions. No mass, hemorrhage, edema or other evidence of acute parenchymal abnormality. No extra-axial hemorrhage. Vascular: There are chronic calcified atherosclerotic changes of the large vessels at the skull base. No unexpected hyperdense vessel. Skull: Normal. Negative for fracture or focal lesion. Sinuses/Orbits: No acute finding. Other: None. IMPRESSION: 1. No acute findings.  No intracranial mass, hemorrhage or edema. 2. Atrophy and chronic small vessel ischemic changes in the white matter. Electronically Signed   By: Franki Cabot M.D.   On: 09/13/2017 20:01   Dg Chest Portable 1 View  Result Date: 09/13/2017 CLINICAL DATA:  Weakness and atrial fibrillation. EXAM: PORTABLE CHEST 1 VIEW COMPARISON:  A 12/16/2014 and prior chest radiograph FINDINGS: Cardiomegaly and pulmonary vascular congestion noted. Mild bibasilar opacities are noted, probably atelectasis. A probable small RIGHT pleural effusion is noted. There is no evidence of pneumothorax. No acute bony abnormalities are present. IMPRESSION: 1. Cardiomegaly with pulmonary vascular congestion. 2. Probable small RIGHT  pleural effusion. 3. Mild bibasilar opacities-favor atelectasis. Electronically Signed   By: Margarette Canada M.D.   On: 09/13/2017 19:43  EKG:   Orders placed or performed during the hospital encounter of 09/13/17  . ED EKG  . ED EKG    ASSESSMENT AND PLAN:   Acute encephalopathy from severe sepsis (HCC) -tachycardic, increased respiratory rate, elevated lactic acid, at the time of admission Likely source is urinary tract infection Patient is started on IV antibiotics Continue IV fluids and supportive treatment Follow-up on the urine culture and sensitivity  Blood cultures are negative so  thought and urine culture is pending MRSA PCR negative.   IV Lopressor as needed    UTI (urinary tract infection) -patient has history of multidrug-resistant organisms   patient initially started on Zosyn but per previous sensitivities she has always had bacteria susceptible to Rocephin, so Zosyn changed to IV Rocephin      HTN (hypertension) -stable, hold antihypertensives for now as the patient's blood pressure is normotensive    Diabetes ([place]) -sliding scale insulin with corresponding glucose checks    HLD (hyperlipidemia) -check fasting lipid panel       All the records are reviewed and case discussed with Care Management/Social Workerr. Management plans discussed with the patient, family and they are in agreement.  CODE STATUS: Full code until discussed with family members  TOTAL TIME TAKING CARE OF THIS PATIENT: 48minutes.   POSSIBLE D/C IN 1-2 DAYS, DEPENDING ON CLINICAL CONDITION.  Note: This dictation was prepared with Dragon dictation along with smaller phrase technology. Any transcriptional errors that result from this process are unintentional.   [name] M.D on 09/14/2017 at 6:52 PM  Between 7am to 6pm - Pager - [phone] After 6pm go to [email or web address] - password [email or web address] [facility]  Office  [phone]  CC: Primary care physician; Rusty Aus, MD

## 2017-09-14 NOTE — Progress Notes (Signed)
No output throughout the night reported by night nurse, bladder scanned patient and yielded 320 cc. Will continue to monitor.

## 2017-09-14 NOTE — Progress Notes (Signed)
Pt's HR in the 120-140's BP 136/81, HR 135, Dr. Margaretmary Eddy notified and orders for metoprolol and 0.9% N/S at 75 cc/hr.Will administer and continue to monitor.

## 2017-09-14 NOTE — Progress Notes (Signed)
Patient arrived to 2A Room 244. Skin assessment completed with Wendelyn Breslow RN and skin intact. A&Ox1, VSS, and Afib on verified tele-box #[id number]. Safety mitts placed to prevent patient from pulling at IV lines. Nursing staff will continue to monitor for any changes in patient status. Earleen Reaper, RN

## 2017-09-15 ENCOUNTER — Inpatient Hospital Stay: Payer: Medicare Other

## 2017-09-15 LAB — GLUCOSE, CAPILLARY
GLUCOSE-CAPILLARY: 124 mg/dL — AB (ref 65–99)
GLUCOSE-CAPILLARY: 166 mg/dL — AB (ref 65–99)
Glucose-Capillary: 101 mg/dL — ABNORMAL HIGH (ref 65–99)
Glucose-Capillary: 129 mg/dL — ABNORMAL HIGH (ref 65–99)

## 2017-09-15 LAB — URINE CULTURE
CULTURE: NO GROWTH
Special Requests: NORMAL

## 2017-09-15 LAB — LIPID PANEL
CHOL/HDL RATIO: 7.8 ratio
CHOLESTEROL: 257 mg/dL — AB (ref 0–200)
HDL: 33 mg/dL — AB (ref >40)
LDL Cholesterol: 165 mg/dL — ABNORMAL HIGH (ref 0–99)
TRIGLYCERIDES: 295 mg/dL — AB (ref <150)
VLDL: 59 mg/dL — ABNORMAL HIGH (ref 0–40)

## 2017-09-15 MED ORDER — FUROSEMIDE 10 MG/ML IJ SOLN
INTRAMUSCULAR | Status: AC
  Administered 2017-09-15
  Filled 2017-09-15: qty 4

## 2017-09-15 NOTE — Progress Notes (Signed)
Family Meeting Note  Advance Directive:yes  Today a meeting took place with the Patient's  daughter in patient's room  Patient is unable to participate due [facility] capacity Baseline dementia and altered   The following clinical team members were present during this meeting:MD  The following were discussed:Patient's diagnosis: Acute encephalopathy from sepsis probably from UTI, baseline dementia, hypertension, hyperlipidemia, diabetes mellitus, diagnoses and treatment plan of care discussed in detail with the patient's daughter at bedside , Patient's progosis: Unable to determine and Goals for treatment: DNR, daughter [name] Share is the healthcare POA  Additional follow-up to be provided: Hospitalist  Time spent during discussion:18 min  [name], MD

## 2017-09-15 NOTE — Progress Notes (Signed)
[place] at [place] NAME: Cynthia Lynch    MR#:  [id number]  DATE OF BIRTH:  01-31-1917  SUBJECTIVE:  CHIEF COMPLAINT: Patient is altered and at baseline she is demented. daughter at bedside  REVIEW OF SYSTEMS:  Review of system unobtainable as the patient is demented and altered  DRUG ALLERGIES:  No Known Allergies  VITALS:  Blood pressure (!) 123/102, pulse 69, temperature 98.4 F (36.9 C), temperature source Oral, resp. rate 18, height 5\' 3"  (1.6 m), weight 57.9 kg (127 lb 10.3 oz), SpO2 96 %.  PHYSICAL EXAMINATION:  GENERAL:  84 y.o.-year-old patient lying in the bed with no acute distress.  EYES: Pupils equal, round, reactive to light and accommodation. No scleral icterus. Extraocular muscles intact.  HEENT: Head atraumatic, normocephalic. Oropharynx and nasopharynx clear.  NECK:  Supple, no jugular venous distention. No thyroid enlargement, no tenderness.  LUNGS: Normal breath sounds bilaterally, no wheezing, rales,rhonchi or crepitation. No use of accessory muscles of respiration.  CARDIOVASCULAR: S1, S2 normal. No murmurs, rubs, or gallops.  ABDOMEN: Soft, nontender, nondistended. Bowel sounds present.  EXTREMITIES: No pedal edema, cyanosis, or clubbing.  NEUROLOGIC: Delirious but arousable  pSYCHIATRIC: The patient is chronically demented and currently disoriented SKIN: No obvious rash, lesion, or ulcer.    LABORATORY PANEL:   CBC Recent Labs  Lab 09/14/17 0934  WBC 8.2  HGB 13.1  HCT 40.6  PLT 183   ------------------------------------------------------------------------------------------------------------------  Chemistries  Recent Labs  Lab 09/13/17 1915 09/14/17 0934  NA 141 142  K 3.5 3.6  CL 104 110  CO2 26 22  GLUCOSE 185* 118*  BUN 24* 19  CREATININE 0.91 0.84  CALCIUM 10.2 8.7*  AST 27  --   ALT 17  --   ALKPHOS 69  --   BILITOT 0.8  --     ------------------------------------------------------------------------------------------------------------------  Cardiac Enzymes Recent Labs  Lab 09/13/17 1915  TROPONINI <0.03   ------------------------------------------------------------------------------------------------------------------  RADIOLOGY:  Ct Head Wo Contrast  Result Date: 09/13/2017 CLINICAL DATA:  Unresponsive, RIGHT-sided facial droop. EXAM: CT HEAD WITHOUT CONTRAST TECHNIQUE: Contiguous axial images were obtained from the base of the skull through the vertex without intravenous contrast. COMPARISON:  Head CT dated 05/27/2016 FINDINGS: Brain: Age related parenchymal atrophy with commensurate dilatation of the ventricles and sulci. Chronic small vessel ischemic changes again noted within the bilateral periventricular and subcortical white matter regions. No mass, hemorrhage, edema or other evidence of acute parenchymal abnormality. No extra-axial hemorrhage. Vascular: There are chronic calcified atherosclerotic changes of the large vessels at the skull base. No unexpected hyperdense vessel. Skull: Normal. Negative for fracture or focal lesion. Sinuses/Orbits: No acute finding. Other: None. IMPRESSION: 1. No acute findings.  No intracranial mass, hemorrhage or edema. 2. Atrophy and chronic small vessel ischemic changes in the white matter. Electronically Signed   By: Franki Cabot M.D.   On: 09/13/2017 20:01   Dg Chest Port 1 View  Result Date: 09/15/2017 CLINICAL DATA:  Acute onset of cough. EXAM: PORTABLE CHEST 1 VIEW COMPARISON:  Chest radiograph performed 09/13/2017 FINDINGS: A small right pleural effusion is noted. Increased interstitial markings are noted, with hazy right-sided airspace opacity. This may reflect asymmetric pulmonary edema. No pneumothorax is seen The cardiomediastinal silhouette is borderline normal in size. No acute osseous abnormalities are identified. IMPRESSION: Small right pleural effusion noted.  Increased interstitial markings, with hazy right-sided airspace opacity. This may reflect asymmetric pulmonary edema. Electronically Signed   By: Garald Balding  M.D.   On: 09/15/2017 00:29    EKG:   Orders placed or performed during the hospital encounter of 09/13/17  . ED EKG  . ED EKG    ASSESSMENT AND PLAN:   Acute encephalopathy from severe sepsis (HCC) -tachycardic, increased respiratory rate, elevated lactic acid, at the time of admission Likely source is urinary tract infection Patient is on IV antibiotics Tachycardia resolved Given IV fluids but then discontinued in view of pulmonary edema Follow-up on the urine culture and sensitivity  Blood cultures are negative so  thought and urine culture is pending MRSA PCR negative.   IV Lopressor as needed    UTI (urinary tract infection) -patient has history of multidrug-resistant organisms   patient initially started on Zosyn but per previous sensitivities she has always had bacteria susceptible to Rocephin, so Zosyn changed to IV Rocephin      HTN (hypertension) -stable, hold antihypertensives for now as the patient's blood pressure is normotensive    Diabetes (HCC) -sliding scale insulin with corresponding glucose checks    HLD (hyperlipidemia) - LDL 165     Disposition: Patient is from [facility] assisted living facility, daughter will discuss with other family members tomorrow regarding taking her home with 24/7 caregivers  All the records are reviewed and case discussed with Care Management/Social Workerr. Management plans discussed with the patient, family and they are in agreement.  CODE STATUS: Full code until discussed with family members  TOTAL TIME TAKING CARE OF THIS PATIENT: 4minutes.   POSSIBLE D/C IN 1-2 DAYS, DEPENDING ON CLINICAL CONDITION.  Note: This dictation was prepared with Dragon dictation along with smaller phrase technology. Any transcriptional errors that result from this process are  unintentional.   [name] M.D on 09/15/2017 at 7:40 PM  Between 7am to 6pm - Pager - [phone] After 6pm go to [email or web address] - password [email or web address] [place] [facility]  Office  [phone]  CC: Primary care physician; Rusty Aus, MD

## 2017-09-15 NOTE — Progress Notes (Signed)
Talked to Dr. Jannifer Franklin about patient's shortness of breath and has fine crackles upon listening with her lungs, patient received 500 ml bolus and IVF is running at 100 ml/hr, order to stopped fluids. Also order for stat CXR and to give 20 IV of lasix and Xoponex. Patient's BP is 154/110 but patient is a little anxious and SpO2 was 98 in 2L of [place]. No other concern at the moment. RN will continue to monitor.

## 2017-09-16 DIAGNOSIS — R6520 Severe sepsis without septic shock: Secondary | ICD-10-CM

## 2017-09-16 DIAGNOSIS — A419 Sepsis, unspecified organism: Principal | ICD-10-CM

## 2017-09-16 LAB — GLUCOSE, CAPILLARY
GLUCOSE-CAPILLARY: 131 mg/dL — AB (ref 65–99)
GLUCOSE-CAPILLARY: 171 mg/dL — AB (ref 65–99)
Glucose-Capillary: 92 mg/dL (ref 65–99)
Glucose-Capillary: 192 mg/dL — ABNORMAL HIGH (ref 65–99)
Glucose-Capillary: 168 mg/dL — ABNORMAL HIGH (ref 65–99)

## 2017-09-16 MED ORDER — DIGOXIN 125 MCG PO TABS
0.1250 mg | ORAL_TABLET | ORAL | Status: DC | PRN
  Administered 2017-09-16: 0.125 mg via ORAL
  Filled 2017-09-16 (×2): qty 1

## 2017-09-16 MED ORDER — FUROSEMIDE 20 MG PO TABS
20.0000 mg | ORAL_TABLET | Freq: Two times a day (BID) | ORAL | Status: DC
  Administered 2017-09-16 – 2017-09-18 (×4): 20 mg via ORAL
  Filled 2017-09-16 (×4): qty 1

## 2017-09-16 MED ORDER — ASPIRIN EC 81 MG PO TBEC
81.0000 mg | DELAYED_RELEASE_TABLET | Freq: Every day | ORAL | Status: DC
  Filled 2017-09-16: qty 1

## 2017-09-16 NOTE — Progress Notes (Signed)
[facility] [place] NAME: Cynthia Lynch    MR#:  [id number]  DATE OF BIRTH:  1916-10-24  SUBJECTIVE:  CHIEF COMPLAINT: Patient is altered and at baseline she is demented.   REVIEW OF SYSTEMS:  Review of system unobtainable as the patient is demented and altered  DRUG ALLERGIES:  No Known Allergies  VITALS:  Blood pressure 129/81, pulse 76, temperature 97.8 F (36.6 C), temperature source Oral, resp. rate 18, height 5\' 3"  (1.6 m), weight 57.9 kg (127 lb 10.3 oz), SpO2 97 %.  PHYSICAL EXAMINATION:  GENERAL:  84 y.o.-year-old patient lying in the bed with no acute distress.  EYES: Pupils equal, round, reactive to light and accommodation. No scleral icterus. Extraocular muscles intact.  HEENT: Head atraumatic, normocephalic. Oropharynx and nasopharynx clear.  NECK:  Supple, no jugular venous distention. No thyroid enlargement, no tenderness.  LUNGS: Normal breath sounds bilaterally, no wheezing, rales,rhonchi or crepitation. No use of accessory muscles of respiration.  CARDIOVASCULAR: S1, S2 normal. No murmurs, rubs, or gallops.  ABDOMEN: Soft, nontender, nondistended. Bowel sounds present.  EXTREMITIES: No pedal edema, cyanosis, or clubbing.  NEUROLOGIC: Delirious but arousable  pSYCHIATRIC: The patient is chronically demented and currently disoriented SKIN: No obvious rash, lesion, or ulcer.    LABORATORY PANEL:   CBC Recent Labs  Lab 09/14/17 0934  WBC 8.2  HGB 13.1  HCT 40.6  PLT 183   ------------------------------------------------------------------------------------------------------------------  Chemistries  Recent Labs  Lab 09/13/17 1915 09/14/17 0934  NA 141 142  K 3.5 3.6  CL 104 110  CO2 26 22  GLUCOSE 185* 118*  BUN 24* 19  CREATININE 0.91 0.84  CALCIUM 10.2 8.7*  AST 27  --   ALT 17  --   ALKPHOS 69  --   BILITOT 0.8  --     ------------------------------------------------------------------------------------------------------------------  Cardiac Enzymes Recent Labs  Lab 09/13/17 1915  TROPONINI <0.03   ------------------------------------------------------------------------------------------------------------------  RADIOLOGY:  Dg Chest Port 1 View  Result Date: 09/15/2017 CLINICAL DATA:  Acute onset of cough. EXAM: PORTABLE CHEST 1 VIEW COMPARISON:  Chest radiograph performed 09/13/2017 FINDINGS: A small right pleural effusion is noted. Increased interstitial markings are noted, with hazy right-sided airspace opacity. This may reflect asymmetric pulmonary edema. No pneumothorax is seen The cardiomediastinal silhouette is borderline normal in size. No acute osseous abnormalities are identified. IMPRESSION: Small right pleural effusion noted. Increased interstitial markings, with hazy right-sided airspace opacity. This may reflect asymmetric pulmonary edema. Electronically Signed   By: Garald Balding M.D.   On: 09/15/2017 00:29    EKG:   Orders placed or performed during the hospital encounter of 09/13/17  . ED EKG  . ED EKG  . EKG 12-Lead  . EKG 12-Lead  . EKG 12-Lead  . EKG 12-Lead  . EKG 12-Lead  . EKG 12-Lead    ASSESSMENT AND PLAN:   Atrial fibrillation with RVR IV Lopressor as needed Metoprolol 25 mg p.o. twice daily for rate control, might consider adding digoxin if no improvement Aspirin 81 mg is added to the regimen Cardiology consult placed   Acute encephalopathy  thought to be from  sepsis (HCC) -tachycardic, increased respiratory rate, elevated lactic acid, at the time of admission Patient is chronically demented at baseline Urine cultures with no growth blood cultures are negative, sepsis ruled out Given IV fluids but then discontinued in view of pulmonary edema MRSA PCR negative.   IV Lopressor as needed     UTI (urinary  tract infection) -rule out  patient initially  started on Zosyn but per previous sensitivities she has always had bacteria susceptible to Rocephin, so Zosyn changed to IV Rocephin Urine culture and blood cultures are negative.  Patient has received 3 days of IV Rocephin will discontinue antibiotics      HTN (hypertension) -stable, hold antihypertensives for now as the patient's blood pressure is normotensive    Diabetes (HCC) -sliding scale insulin with corresponding glucose checks    HLD (hyperlipidemia) - LDL 165     Disposition: Patient is from [facility], daughter will discuss with other family members today regarding taking her home with 24/7 caregivers  All the records are reviewed and case discussed with Care Management/Social Workerr. Management plans discussed with the patient, family and they are in agreement.  CODE STATUS: Full code until discussed with family members  TOTAL TIME TAKING CARE OF THIS PATIENT: 86minutes.   POSSIBLE D/C IN 1-2 DAYS, DEPENDING ON CLINICAL CONDITION.  Note: This dictation was prepared with Dragon dictation along with smaller phrase technology. Any transcriptional errors that result from this process are unintentional.   [name] M.D on 09/16/2017 at 12:24 PM  Between 7am to 6pm - Pager - [phone] After 6pm go to [email or web address] - password [email or web address] [facility]  Office  [phone]  CC: Primary care physician; Rusty Aus, MD

## 2017-09-16 NOTE — Consult Note (Signed)
CARDIOLOGY CONSULT NOTE  Patient ID: Cynthia Lynch, MRN: [id number], DOB/AGE: [birth year] 84 y.o. Admit date: 09/13/2017 Date of Consult: 09/16/2017  Primary Physician: Rusty Aus, MD Primary Cardiologist: none Cynthia Lynch is a 84 y.o. female who is being seen today for the evaluation of atrial fib at the request of Dr Wilburn Cornelia   Chief Complaint: atrial fbi   HPI Cynthia Lynch is a 84 y.o. female who we are asked to see for atrial fibrillation  She was admitted 4/18 with altered mental status lethargy slumped over in her wheelchair.  She met sepsis criteria and was found to have a UTI.  Yesterday p.m. she was recognized as having atrial fibrillation.  Going back to telemetry it was evident when she was first connected on 4/18.  ECG from the emergency room was not available.  Last available ECG demonstrating sinus rhythm with 5/17 no outpatient  records report atrial fibrillation and no ECGs are available  Thromboembolic risk factors are notable for age hypertension diabetes gender for CHADS VAS score of greater than or equal to 5  Past medical history is notable for hypertension, diabetes, history of a subarachnoid hemorrhage question cause, breast cancer and ovarian cancer and dementia.   Past Medical History:  Diagnosis Date  . Alzheimer's dementia   . Cancer (HCC)    breast, ovarian  . Chronic kidney disease (CKD), stage III (moderate) (HCC)   . Diabetes mellitus without complication ([place])   . Hematoma    right forehead  . Hyperlipidemia   . Hypertension   . Subarchnoid hemorrhage-brief coma       Surgical History:  Past Surgical History:  Procedure Laterality Date  . ABDOMINAL HYSTERECTOMY     daughter not sure, pt unable to tell  . EYE SURGERY    . MASTECTOMY PARTIAL / LUMPECTOMY Left      Home Meds: Prior to Admission medications   Medication Sig Start Date End Date Taking? Authorizing Provider  acetaminophen (TYLENOL) 325 MG tablet Take  650 mg by mouth 2 (two) times daily.   Yes [provider]  amLODipine (NORVASC) 2.5 MG tablet Take 2.5 mg by mouth daily.   Yes [provider]  Calcium Citrate-Vitamin D (CITRACAL MAXIMUM) 315-250 MG-UNIT TABS Take 1 tablet by mouth 2 (two) times daily.   Yes [provider]  conjugated estrogens (PREMARIN) vaginal cream Place 1 Applicatorful vaginally every Monday, Wednesday, and Friday. Apply pea sized amount to uretheral openings at bedtime on Monday, Wednesday, and Friday.   Yes [provider]  Cranberry 250 MG CAPS Take 250 mg by mouth 2 (two) times daily.   Yes [provider]  dextromethorphan-guaiFENesin (MUCINEX DM) 30-600 MG 12hr tablet Take 1 tablet by mouth 2 (two) times daily as needed for cough.   Yes [provider]  furosemide (LASIX) 20 MG tablet Take 1 tablet by mouth daily as needed for fluid or edema.  10/12/16  Yes [provider]  glimepiride (AMARYL) 2 MG tablet Take 2 mg by mouth 2 (two) times daily.   Yes [provider]  guaiFENesin (ROBITUSSIN) 100 MG/5ML liquid Take 200 mg by mouth every 6 (six) hours as needed for cough. Take up to 48 hours.   Yes [provider]  hydrocortisone cream 1 % Apply 1 application topically 2 (two) times daily as needed (dry skin).    Yes [provider]  latanoprost (XALATAN) 0.005 % ophthalmic solution Place 1 drop into  both eyes at bedtime.    Yes [provider]  loratadine (CLARITIN) 10 MG tablet Take 1 tablet by mouth every morning.   Yes [provider]  senna (SENOKOT) 8.6 MG tablet Take 1 tablet by mouth daily.   Yes [provider]  carvedilol (COREG) 6.25 MG tablet Take 1 tablet (6.25 mg total) by mouth 2 (two) times daily with a meal. Patient not taking: Reported on 05/27/2016 01/25/15   Rusty Aus, MD    Inpatient Medications:  . aspirin EC  81 mg Oral Daily  . enoxaparin (LOVENOX) injection  30 mg  Subcutaneous Q24H  . furosemide  20 mg Oral BID  . insulin aspart  0-5 Units Subcutaneous QHS  . insulin aspart  0-9 Units Subcutaneous TID WC  . latanoprost  1 drop Both Eyes QHS  . metoprolol tartrate  25 mg Oral BID    Allergies: No Known Allergies  Social History   Socioeconomic History  . Marital status: Widowed    Spouse name: Not on file  . Number of children: Not on file  . Years of education: Not on file  . Highest education level: Not on file  Occupational History  . Not on file  Social Needs  . Financial resource strain: Not on file  . Food insecurity:    Worry: Not on file    Inability: Not on file  . Transportation needs:    Medical: Not on file    Non-medical: Not on file  Tobacco Use  . Smoking status: Never Smoker  . Smokeless tobacco: Never Used  Substance and Sexual Activity  . Alcohol use: No  . Drug use: No  . Sexual activity: Not Currently  Lifestyle  . Physical activity:    Days per week: Not on file    Minutes per session: Not on file  . Stress: Not on file  Relationships  . Social connections:    Talks on phone: Not on file    Gets together: Not on file    Attends religious service: Not on file    Active member of club or organization: Not on file    Attends meetings of clubs or organizations: Not on file    Relationship status: Not on file  . Intimate partner violence:    Fear of current or ex partner: Not on file    Emotionally abused: Not on file    Physically abused: Not on file    Forced sexual activity: Not on file  Other Topics Concern  . Not on file  Social History Narrative  . Not on file     Family History  Problem Relation Age of Onset  . Hypertension Unknown      ROS:    The patient is demented and unable to give any history   Physical Exam:  Blood pressure 129/81, pulse 76, temperature 97.8 F (36.6 C), temperature source Oral, resp. rate 18, height 5' 3"  (1.6 m), weight 127 lb 10.3 oz (57.9 kg), SpO2 97  %. General: Older Caucasian female lying in bed difficult to arouse hands in mittens Head: Normocephalic, atraumatic,  \ EENT: normal  Lymph Nodes:  none Neck: Negative for carotid bruits. JVD not elevated. Back:with  Kyphosis  Lungs: Clear laterally to auscultation without wheezes  Breathing is unlabored. Heart: irregularly irregular rate and rhythm with a 2 /6 systolic  murmur . No rubs, or gallops appreciated. Abdomen: Soft, non-tender, non-distended with normoactive bowel sounds. No hepatomegaly. No rebound/guarding. No  obvious abdominal masses. Msk:  Strength and tone unable to assess Extremities: No clubbing or cyanosis. No*  edema.  Distal pedal pulses are 2+ and equal bilaterally. Skin: Warm and Dry Neuro: Arousable barely semipurposeful movements to try to remove mittens Psych:  Responds to my cold hands      Labs: Chemistry Recent Labs  Lab 09/13/17 1915 09/14/17 0934  NA 141 142  K 3.5 3.6  CL 104 110  CO2 26 22  GLUCOSE 185* 118*  BUN 24* 19  CREATININE 0.91 0.84  CALCIUM 10.2 8.7*  PROT 7.6  --   ALBUMIN 4.1  --   AST 27  --   ALT 17  --   ALKPHOS 69  --   BILITOT 0.8  --   GFRNONAA 51* 56*  GFRAA 59* >60  ANIONGAP 11 10     Hematology Recent Labs  Lab 09/13/17 1915 09/14/17 0934  WBC 11.4* 8.2  RBC 4.98 4.37  HGB 14.9 13.1  HCT 46.0 40.6  MCV 92.5 92.8  MCH 30.0 29.9  MCHC 32.4 32.2  RDW 15.2* 15.1*  PLT 232 183    Cardiac Enzymes Recent Labs  Lab 09/13/17 1915  TROPONINI <0.03   No results for input(s): TROPIPOC in the last 168 hours.   BNPNo results for input(s): BNP, PROBNP in the last 168 hours.   DDimer No results for input(s): DDIMER in the last 168 hours.   Lab Results  Component Value Date   CHOL 257 (H) 09/15/2017   HDL 33 (L) 09/15/2017   LDLCALC 165 (H) 09/15/2017   TRIG 295 (H) 09/15/2017    Thyroid Function Tests: Recent Labs    09/14/17 0934  TSH 2.788   Miscellaneous No results found for:  DDIMER  Radiology/Studies:  Ct Head Wo Contrast  Result Date: 09/13/2017 CLINICAL DATA:  Unresponsive, RIGHT-sided facial droop. EXAM: CT HEAD WITHOUT CONTRAST TECHNIQUE: Contiguous axial images were obtained from the base of the skull through the vertex without intravenous contrast. COMPARISON:  Head CT dated 05/27/2016 FINDINGS: Brain: Age related parenchymal atrophy with commensurate dilatation of the ventricles and sulci. Chronic small vessel ischemic changes again noted within the bilateral periventricular and subcortical white matter regions. No mass, hemorrhage, edema or other evidence of acute parenchymal abnormality. No extra-axial hemorrhage. Vascular: There are chronic calcified atherosclerotic changes of the large vessels at the skull base. No unexpected hyperdense vessel. Skull: Normal. Negative for fracture or focal lesion. Sinuses/Orbits: No acute finding. Other: None. IMPRESSION: 1. No acute findings.  No intracranial mass, hemorrhage or edema. 2. Atrophy and chronic small vessel ischemic changes in the white matter. Electronically Signed   By: Franki Cabot M.D.   On: 09/13/2017 20:01   Dg Chest Port 1 View  Result Date: 09/15/2017 CLINICAL DATA:  Acute onset of cough. EXAM: PORTABLE CHEST 1 VIEW COMPARISON:  Chest radiograph performed 09/13/2017 FINDINGS: A small right pleural effusion is noted. Increased interstitial markings are noted, with hazy right-sided airspace opacity. This may reflect asymmetric pulmonary edema. No pneumothorax is seen The cardiomediastinal silhouette is borderline normal in size. No acute osseous abnormalities are identified. IMPRESSION: Small right pleural effusion noted. Increased interstitial markings, with hazy right-sided airspace opacity. This may reflect asymmetric pulmonary edema. Electronically Signed   By: Garald Balding M.D.   On: 09/15/2017 00:29   Dg Chest Portable 1 View  Result Date: 09/13/2017 CLINICAL DATA:  Weakness and atrial fibrillation.  EXAM: PORTABLE CHEST 1 VIEW COMPARISON:  A 12/16/2014 and prior chest  radiograph FINDINGS: Cardiomegaly and pulmonary vascular congestion noted. Mild bibasilar opacities are noted, probably atelectasis. A probable small RIGHT pleural effusion is noted. There is no evidence of pneumothorax. No acute bony abnormalities are present. IMPRESSION: 1. Cardiomegaly with pulmonary vascular congestion. 2. Probable small RIGHT pleural effusion. 3. Mild bibasilar opacities-favor atelectasis. Electronically Signed   By: Margarette Canada M.D.   On: 09/13/2017 19:43    EKG: Atrial fibrillation with a rapid rate personally reviewed   Assessment and Plan:  Atrial fib persistent  Multiple thromboembolic risk factors-CHADS VASc score greater than or equal to 5  Dementia  UTI/sepsis   The patient has persistent atrial fibrillation with a moderately rapid rate.  Metoprolol for rate control is reasonable and a heart rate target of 90-100 is also reasonable (Race 2 trial)   Aspirin is not indicated.  It has recently be removed from the guidelines.  If she is not a candidate for anticoagulation then I would treat her with nothing  Call if we can be of further assistance    Virl Axe

## 2017-09-16 NOTE — Plan of Care (Signed)
Rounded with MD. Patient's HR has been fluctuating between 100-130's. Dr. Margaretmary Eddy at bedside and notified. Per Dr. Margaretmary Eddy will put orders for digoxin. Will continue to monitor.  Problem: Health Behavior/Discharge Planning: Goal: Ability to manage health-related needs will improve Outcome: Progressing   Problem: Clinical Measurements: Goal: Will remain free from infection Outcome: Progressing

## 2017-09-17 DIAGNOSIS — G9340 Encephalopathy, unspecified: Secondary | ICD-10-CM

## 2017-09-17 DIAGNOSIS — I481 Persistent atrial fibrillation: Secondary | ICD-10-CM

## 2017-09-17 DIAGNOSIS — R4182 Altered mental status, unspecified: Secondary | ICD-10-CM

## 2017-09-17 LAB — GLUCOSE, CAPILLARY
GLUCOSE-CAPILLARY: 137 mg/dL — AB (ref 65–99)
GLUCOSE-CAPILLARY: 127 mg/dL — AB (ref 65–99)
GLUCOSE-CAPILLARY: 159 mg/dL — AB (ref 65–99)
Glucose-Capillary: 111 mg/dL — ABNORMAL HIGH (ref 65–99)

## 2017-09-17 LAB — LACTIC ACID, PLASMA
LACTIC ACID, VENOUS: 1.7 mmol/L (ref 0.5–1.9)
Lactic Acid, Venous: 1.3 mmol/L (ref 0.5–1.9)

## 2017-09-17 MED ORDER — DIGOXIN 0.25 MG/ML IJ SOLN
0.2500 mg | Freq: Once | INTRAMUSCULAR | Status: AC
  Administered 2017-09-17: 0.25 mg via INTRAVENOUS
  Filled 2017-09-17 (×2): qty 1

## 2017-09-17 NOTE — Clinical Social Work Note (Signed)
CSW spoke to [place] at [facility], she confirmed that patient is from ALF, not memory care, and was almost total assist.  Thayer Headings said that patient's family would like hospice or palliative to follow her at [place], [place] informed Thayer Headings that a palliative consult was put in so they can speak with the family to discuss discharge plans.  CSW to continue to follow patient's progress throughout discharge planning.  Jones Broom. [place], MSW, [place]  09/17/2017 1:18 PM

## 2017-09-17 NOTE — Care Management Important Message (Signed)
Copy of signed IM left in patient's room.    

## 2017-09-17 NOTE — Progress Notes (Signed)
[facility] [place] NAME: Cynthia Lynch    MR#:  [id number]  DATE OF BIRTH:  1917/01/15  SUBJECTIVE:  CHIEF COMPLAINT: Patient is altered and at baseline she is demented.  Atrial fibrillation with RVR  REVIEW OF SYSTEMS:  Review of system unobtainable as the patient is demented and altered  DRUG ALLERGIES:  No Known Allergies  VITALS:  Blood pressure 105/79, pulse 68, temperature (!) 97 F (36.1 C), temperature source Axillary, resp. rate 18, height 5\' 3"  (1.6 m), weight 57.9 kg (127 lb 10.3 oz), SpO2 95 %.  PHYSICAL EXAMINATION:  GENERAL:  84 y.o.-year-old patient lying in the bed with no acute distress.  EYES: Pupils equal, round, reactive to light and accommodation. No scleral icterus. Extraocular muscles intact.  HEENT: Head atraumatic, normocephalic. Oropharynx and nasopharynx clear.  NECK:  Supple, no jugular venous distention. No thyroid enlargement, no tenderness.  LUNGS: Normal breath sounds bilaterally, no wheezing, rales,rhonchi or crepitation. No use of accessory muscles of respiration.  CARDIOVASCULAR: Irregularly irregular, no  rubs, or gallops.  ABDOMEN: Soft, nontender, nondistended. Bowel sounds present.  EXTREMITIES: No pedal edema, cyanosis, or clubbing.  NEUROLOGIC: Delirious but arousable  pSYCHIATRIC: The patient is chronically demented and currently disoriented SKIN: No obvious rash, lesion, or ulcer.    LABORATORY PANEL:   CBC Recent Labs  Lab 09/14/17 0934  WBC 8.2  HGB 13.1  HCT 40.6  PLT 183   ------------------------------------------------------------------------------------------------------------------  Chemistries  Recent Labs  Lab 09/13/17 1915 09/14/17 0934  NA 141 142  K 3.5 3.6  CL 104 110  CO2 26 22  GLUCOSE 185* 118*  BUN 24* 19  CREATININE 0.91 0.84  CALCIUM 10.2 8.7*  AST 27  --   ALT 17  --   ALKPHOS 69  --   BILITOT 0.8  --     ------------------------------------------------------------------------------------------------------------------  Cardiac Enzymes Recent Labs  Lab 09/13/17 1915  TROPONINI <0.03   ------------------------------------------------------------------------------------------------------------------  RADIOLOGY:  No results found.  EKG:   Orders placed or performed during the hospital encounter of 09/13/17  . ED EKG  . ED EKG  . EKG 12-Lead  . EKG 12-Lead  . EKG 12-Lead  . EKG 12-Lead  . EKG 12-Lead  . EKG 12-Lead    ASSESSMENT AND PLAN:   Atrial fibrillation with RVR IV Lopressor as needed Metoprolol 25 mg p.o. twice daily for rate control, adding digoxin  Aspirin 81 mg is added to the regimen but discontinued as patient has history of subarachnoid hemorrhage in the past as per old medical records Appreciate cardiology recommendations   Acute encephalopathy  thought to be from  sepsis ([place]) -tachycardic, increased respiratory rate, elevated lactic acid, at the time of admission Patient is chronically demented at baseline Sepsis rule out Urine cultures with no growth blood cultures are negative,  Given IV fluids but then discontinued in view of pulmonary edema MRSA PCR negative.   IV Lopressor as needed     UTI (urinary tract infection) -ruled out  patient initially started on Zosyn but per previous sensitivities she has always had bacteria susceptible to Rocephin, so Zosyn changed to IV Rocephin Urine culture and blood cultures are negative.  Patient has received 3 days of IV Rocephin ,discontinued antibiotics      HTN (hypertension) -stable, hold antihypertensives for now as the patient's blood pressure is normotensive    Diabetes (HCC) -sliding scale insulin with corresponding glucose checks    HLD (hyperlipidemia) - LDL  165   FTT -palliative care consult is placed daughter wants to know if patient meets criteria for hospice care at the facility  Disposition:  Patient is from [facility] assisted living facility, daughter wants to discuss with palliative care  All the records are reviewed and case discussed with Care Management/Social Workerr. Management plans discussed with the patient, family and they are in agreement.  CODE STATUS: Full code until discussed with family members  TOTAL TIME TAKING CARE OF THIS PATIENT: 81minutes.   POSSIBLE D/C IN 1-2 DAYS, DEPENDING ON CLINICAL CONDITION.  Note: This dictation was prepared with Dragon dictation along with smaller phrase technology. Any transcriptional errors that result from this process are unintentional.   [name] M.D on 09/17/2017 at 10:38 AM  Between 7am to 6pm - Pager - [phone] After 6pm go to [email or web address] - password [email or web address] [facility]  Office  [phone]  CC: Primary care physician; Rusty Aus, MD

## 2017-09-17 NOTE — Progress Notes (Signed)
Progress Note  Patient Name: Cynthia Lynch Date of Encounter: 09/17/2017  Primary Cardiologist: New to [facility]  Subjective   Alert, pleasant, smiling No pain or SOB,  Minimally conversant  Inpatient Medications    Scheduled Meds: . aspirin EC  81 mg Oral Daily  . enoxaparin (LOVENOX) injection  30 mg Subcutaneous Q24H  . furosemide  20 mg Oral BID  . insulin aspart  0-5 Units Subcutaneous QHS  . insulin aspart  0-9 Units Subcutaneous TID WC  . latanoprost  1 drop Both Eyes QHS  . metoprolol tartrate  25 mg Oral BID   Continuous Infusions:  PRN Meds: acetaminophen **OR** acetaminophen, digoxin, levalbuterol, metoprolol tartrate, ondansetron **OR** ondansetron (ZOFRAN) IV   Vital Signs    Vitals:   09/16/17 1626 09/16/17 1633 09/16/17 1943 09/16/17 2242  BP: 98/71  109/73   Pulse: 92 (!) 124 (!) 106 (!) 130  Resp: 18  18   Temp:   98.3 F (36.8 C)   TempSrc:      SpO2: 100%  97%   Weight:      Height:        Intake/Output Summary (Last 24 hours) at 09/17/2017 0801 Last data filed at 09/16/2017 1825 Gross per 24 hour  Intake 240 ml  Output 200 ml  Net 40 ml   Filed Weights   09/13/17 2123 09/13/17 2341  Weight: 126 lb 6.4 oz (57.3 kg) 127 lb 10.3 oz (57.9 kg)    Telemetry    Atrial fib with rate >100, average 100 to 130 - Personally Reviewed  ECG      Physical Exam   GEN: No acute distress.   Neck: No JVD Cardiac: irreg irreg, tachy, , no murmurs, rubs, or gallops.  Respiratory: Clear to auscultation bilaterally. GI: Soft, nontender, non-distended  MS: No edema; No deformity. Neuro:  Nonfocal  Psych: alert, not very conversant, poor historian  Labs    Chemistry Recent Labs  Lab 09/13/17 1915 09/14/17 0934  NA 141 142  K 3.5 3.6  CL 104 110  CO2 26 22  GLUCOSE 185* 118*  BUN 24* 19  CREATININE 0.91 0.84  CALCIUM 10.2 8.7*  PROT 7.6  --   ALBUMIN 4.1  --   AST 27  --   ALT 17  --   ALKPHOS 69  --   BILITOT 0.8  --   GFRNONAA  51* 56*  GFRAA 59* >60  ANIONGAP 11 10     Hematology Recent Labs  Lab 09/13/17 1915 09/14/17 0934  WBC 11.4* 8.2  RBC 4.98 4.37  HGB 14.9 13.1  HCT 46.0 40.6  MCV 92.5 92.8  MCH 30.0 29.9  MCHC 32.4 32.2  RDW 15.2* 15.1*  PLT 232 183    Cardiac Enzymes Recent Labs  Lab 09/13/17 1915  TROPONINI <0.03   No results for input(s): TROPIPOC in the last 168 hours.   BNPNo results for input(s): BNP, PROBNP in the last 168 hours.   DDimer No results for input(s): DDIMER in the last 168 hours.   Radiology    No results found.  Cardiac Studies     Patient Profile     Cynthia Lynch is a 84 y.o. female who we are asked to see for atrial fibrillation  She was admitted 4/18 with altered mental status lethargy slumped over in her wheelchair.  She met sepsis criteria and was found to have a UTI.  Assessment & Plan    Atrial fib persistent -  poorly controlled rate on metoprolol dose Multiple thromboembolic risk factors-CHADS VASc score greater 5 --Aspirin is not indicated.  It has recently be removed from the guidelines.  If she is not a candidate for anticoagulation then I would treat her with nothing ---unable to advance metoprolol secondary to low BP ---will add digoxin 0.25 today with 0.125 every other day If no improvement and rate still 110 to 130, may need to add amiodarone  Dementia Pleasant this AM, No nursing notes with overnight events  UTI/sepsis on ABX Alert and does not appear to have active sepsis at this time   Total encounter time more than 25 minutes  Greater than 50% was spent in counseling and coordination of care with the patient   For questions or updates, please contact [place] Please consult [email or web address] for contact info under Cardiology/STEMI.      Signed, Ida Rogue, MD  09/17/2017, 8:01 AM

## 2017-09-18 DIAGNOSIS — I4891 Unspecified atrial fibrillation: Secondary | ICD-10-CM

## 2017-09-18 DIAGNOSIS — Z515 Encounter for palliative care: Secondary | ICD-10-CM

## 2017-09-18 DIAGNOSIS — N390 Urinary tract infection, site not specified: Secondary | ICD-10-CM

## 2017-09-18 DIAGNOSIS — Z7189 Other specified counseling: Secondary | ICD-10-CM

## 2017-09-18 LAB — CULTURE, BLOOD (ROUTINE X 2)
CULTURE: NO GROWTH
Culture: NO GROWTH
SPECIAL REQUESTS: ADEQUATE
Special Requests: ADEQUATE

## 2017-09-18 LAB — GLUCOSE, CAPILLARY
GLUCOSE-CAPILLARY: 145 mg/dL — AB (ref 65–99)
Glucose-Capillary: 141 mg/dL — ABNORMAL HIGH (ref 65–99)
Glucose-Capillary: 137 mg/dL — ABNORMAL HIGH (ref 65–99)

## 2017-09-18 MED ORDER — ENSURE ENLIVE PO LIQD
237.0000 mL | Freq: Two times a day (BID) | ORAL | Status: DC
  Administered 2017-09-18: 237 mL via ORAL

## 2017-09-18 MED ORDER — DIGOXIN 62.5 MCG PO TABS: 0.0625 mg | ORAL_TABLET | Freq: Every day | ORAL | 0 refills | Status: AC

## 2017-09-18 MED ORDER — FUROSEMIDE 20 MG PO TABS: 20.0000 mg | ORAL_TABLET | Freq: Two times a day (BID) | ORAL | 0 refills | Status: AC

## 2017-09-18 MED ORDER — METOPROLOL TARTRATE 50 MG PO TABS: 50.0000 mg | ORAL_TABLET | Freq: Two times a day (BID) | ORAL | 0 refills | Status: AC

## 2017-09-18 MED ORDER — ENSURE ENLIVE PO LIQD: 237.0000 mL | Freq: Two times a day (BID) | ORAL | 1 refills | Status: AC

## 2017-09-18 MED ORDER — METOPROLOL TARTRATE 50 MG PO TABS
50.0000 mg | ORAL_TABLET | Freq: Two times a day (BID) | ORAL | Status: DC
  Administered 2017-09-18: 50 mg via ORAL
  Filled 2017-09-18: qty 1

## 2017-09-18 MED ORDER — DIGOXIN 0.0625 MG HALF TABLET
0.0625 mg | ORAL_TABLET | Freq: Every day | ORAL | Status: DC
  Administered 2017-09-18: 0.0625 mg via ORAL
  Filled 2017-09-18: qty 1

## 2017-09-18 NOTE — Progress Notes (Signed)
Pt discharged to [facility] this afternoon, daughter at bedside, VSS, no complaints at this time pt seems to be back to baseline at this time.

## 2017-09-18 NOTE — Discharge Summary (Signed)
[place] [facility] [place] NAME: Cynthia Lynch    MR#:  [id number]  DATE OF BIRTH:  1916/12/13  DATE OF ADMISSION:  09/13/2017 ADMITTING PHYSICIAN: Lance Coon, MD  DATE OF DISCHARGE: 4/ 23/19 PRIMARY CARE PHYSICIAN: Rusty Aus, MD    ADMISSION DIAGNOSIS:  Altered mental status, unspecified altered mental status type [R41.82] Atrial fibrillation, unspecified type ([place]) [I48.91]  DISCHARGE DIAGNOSIS:  Principal Problem:   Severe sepsis ([place]) Active Problems:   UTI (urinary tract infection)   Acute encephalopathy   HTN (hypertension)   HLD (hyperlipidemia)   Diabetes ([place])   SECONDARY DIAGNOSIS:   Past Medical History:  Diagnosis Date  . Alzheimer's dementia   . Cancer (HCC)    breast, ovarian  . Chronic kidney disease (CKD), stage III (moderate) (HCC)   . Diabetes mellitus without complication ([place])   . Hematoma    right forehead  . Hyperlipidemia   . Hypertension   . Subarchnoid hemorrhage-brief coma     HOSPITAL COURSE:  HPI Cynthia Lynch  is a 84 y.o. female who presents with lethargy, altered mental status.  Patient was at the facility where she resides at dinnertime and was found slumped over and not really responding much.  She does not contribute information to HPI today.  She was brought to the ED for evaluation.  Here she met sepsis criteria and was found to have a UTI.  Hospitalist will call for admission   Atrial fibrillation with RVR IV Lopressor as needed Metoprolol 50 mg p.o. twice daily for rate control, adding digoxin  Aspirin 81 mg is added to the regimen but discontinued .It has recently be removed from the guidelines. If she is not a candidate for anticoagulation then  would treat her with nothing Appreciate cardiology recommendations   Acute encephalopathy  thought to be from  sepsis ([place]) -tachycardic, increased respiratory rate, elevated lactic acid, at the time of admission Patient is  chronically demented at baseline Sepsis rule out Urine cultures with no growth blood cultures are negative,  Given IV fluids but then discontinued in view of pulmonary edema MRSA PCR negative.   IV Lopressor as needed   UTI (urinary tract infection) -ruled out  patient initially started on Zosyn but per previous sensitivities she has always had bacteria susceptible to Rocephin, so Zosyn changed to IV Rocephin Urine culture and blood cultures are negative.  Patient has received 3 days of IV Rocephin ,discontinued antibiotics   HTN (hypertension) -stable, hold antihypertensives for now as the patient's blood pressure is normotensive  Diabetes (HCC) -sliding scale insulin with corresponding glucose checks  HLD (hyperlipidemia) - LDL 165   FTT -palliative care consult is placed daughter discussed with palliative care plan is to continue palliative care at the facility  Disposition: Patient is from [facility], Need to get follow-up All the records are reviewed and case discussed with Care Management/Social Workerr. Management plans discussed with the patient, family and they are in agreement.    DISCHARGE CONDITIONS:   FAIR  CONSULTS OBTAINED:  Treatment Team:  Deboraha Sprang, MD   PROCEDURES  NONE   DRUG ALLERGIES:  No Known Allergies  DISCHARGE MEDICATIONS:   Allergies as of 09/18/2017   No Known Allergies     Medication List    STOP taking these medications   amLODipine 2.5 MG tablet Commonly known as:  NORVASC   carvedilol 6.25 MG tablet Commonly known as:  COREG  dextromethorphan-guaiFENesin 30-600 MG 12hr tablet Commonly known as:  MUCINEX DM     TAKE these medications   acetaminophen 325 MG tablet Commonly known as:  TYLENOL Take 650 mg by mouth 2 (two) times daily.   CITRACAL MAXIMUM 315-250 MG-UNIT Tabs Generic drug:  Calcium Citrate-Vitamin D Take 1 tablet by mouth 2 (two) times daily.   conjugated  estrogens vaginal cream Commonly known as:  PREMARIN Place 1 Applicatorful vaginally every Monday, Wednesday, and Friday. Apply pea sized amount to uretheral openings at bedtime on Monday, Wednesday, and Friday.   Cranberry 250 MG Caps Take 250 mg by mouth 2 (two) times daily.   Digoxin 62.5 MCG Tabs Take 0.0625 mg by mouth daily. Start taking on:  09/19/2017   feeding supplement (ENSURE ENLIVE) Liqd Take 237 mLs by mouth 2 (two) times daily between meals.   furosemide 20 MG tablet Commonly known as:  LASIX Take 1 tablet (20 mg total) by mouth 2 (two) times daily. What changed:    when to take this  reasons to take this   glimepiride 2 MG tablet Commonly known as:  AMARYL Take 2 mg by mouth 2 (two) times daily.   guaiFENesin 100 MG/5ML liquid Commonly known as:  ROBITUSSIN Take 200 mg by mouth every 6 (six) hours as needed for cough. Take up to 48 hours.   hydrocortisone cream 1 % Apply 1 application topically 2 (two) times daily as needed (dry skin).   latanoprost 0.005 % ophthalmic solution Commonly known as:  XALATAN Place 1 drop into both eyes at bedtime.   loratadine 10 MG tablet Commonly known as:  CLARITIN Take 1 tablet by mouth every morning.   metoprolol tartrate 50 MG tablet Commonly known as:  LOPRESSOR Take 1 tablet (50 mg total) by mouth 2 (two) times daily.   SENOKOT 8.6 MG tablet Generic drug:  senna Take 1 tablet by mouth daily.        DISCHARGE INSTRUCTIONS:   Follow with primary care physician at the facility in 4 to 5 days Follow-up wi palliative care Follow-up with cardiology in a week  DIET:  Cardiac diet and Diabetic diet  DISCHARGE CONDITION:  Stable  ACTIVITY:  Activity as tolerated  OXYGEN:  Home Oxygen: No.   Oxygen Delivery: room air  DISCHARGE LOCATION:   [facility]  If you experience worsening of your admission symptoms, develop shortness of breath, life threatening emergency, suicidal  or homicidal thoughts you must seek medical attention immediately by calling 911 or calling your MD immediately  if symptoms less severe.  You Must read complete instructions/literature along with all the possible adverse reactions/side effects for all the Medicines you take and that have been prescribed to you. Take any new Medicines after you have completely understood and accpet all the possible adverse reactions/side effects.   Please note  You were cared for by a hospitalist during your hospital stay. If you have any questions about your discharge medications or the care you received while you were in the hospital after you are discharged, you can call the unit and asked to speak with the hospitalist on call if the hospitalist that took care of you is not available. Once you are discharged, your primary care physician will handle any further medical issues. Please note that NO REFILLS for any discharge medications will be authorized once you are discharged, as it is imperative that you return to your primary care physician (or establish a relationship with a primary care  physician if you do not have one) for your aftercare needs so that they can reassess your need for medications and monitor your lab values.     Today  Chief Complaint  Patient presents with  . Altered Mental Status   Patient is doing much better, heart rate is well controlled.  Daughter has discussed with palliative care  ROS: Patient is demented   VITAL SIGNS:  Blood pressure 138/78, pulse (!) 101, temperature 98.3 F (36.8 C), temperature source Oral, resp. rate 18, height 5' 3"  (1.6 m), weight 57.9 kg (127 lb 10.3 oz), SpO2 97 %.  I/O:    Intake/Output Summary (Last 24 hours) at 09/18/2017 1413 Last data filed at 09/18/2017 0100 Gross per 24 hour  Intake -  Output 900 ml  Net -900 ml    PHYSICAL EXAMINATION:  GENERAL:  84 y.o.-year-old patient lying in the bed with no acute distress.  EYES: Pupils equal,  round, reactive to light and accommodation. No scleral icterus. Extraocular muscles intact.  HEENT: Head atraumatic, normocephalic. Oropharynx and nasopharynx clear.  NECK:  Supple, no jugular venous distention. No thyroid enlargement, no tenderness.  LUNGS: Normal breath sounds bilaterally, no wheezing, rales,rhonchi or crepitation. No use of accessory muscles of respiration.  CARDIOVASCULAR: Irregularly regular. No rubs, or gallops.  ABDOMEN: Soft, non-tender, non-distended. Bowel sounds present. EXTREMITIES: No pedal edema, cyanosis, or clubbing.  NEUROLOGIC:   Sensation intact. Gait not checked.  PSYCHIATRIC: The patient is alert and oriented 1 SKIN: No obvious rash, lesion, or ulcer.   DATA REVIEW:   CBC Recent Labs  Lab 09/14/17 0934  WBC 8.2  HGB 13.1  HCT 40.6  PLT 183    Chemistries  Recent Labs  Lab 09/13/17 1915 09/14/17 0934  NA 141 142  K 3.5 3.6  CL 104 110  CO2 26 22  GLUCOSE 185* 118*  BUN 24* 19  CREATININE 0.91 0.84  CALCIUM 10.2 8.7*  AST 27  --   ALT 17  --   ALKPHOS 69  --   BILITOT 0.8  --     Cardiac Enzymes Recent Labs  Lab 09/13/17 1915  TROPONINI <0.03    Microbiology Results  Results for orders placed or performed during the hospital encounter of 09/13/17  Blood Culture (routine x 2)     Status: None   Collection Time: 09/13/17  9:01 PM  Result Value Ref Range Status   Specimen Description BLOOD BLOOD RIGHT FOREARM  Final   Special Requests Blood Culture adequate volume  Final   Culture   Final    NO GROWTH 5 DAYS Performed at [facility], [address] [place], [place] [address]    Report Status 09/18/2017 FINAL  Final  Blood Culture (routine x 2)     Status: None   Collection Time: 09/13/17  9:01 PM  Result Value Ref Range Status   Specimen Description BLOOD RIGHT ANTECUBITAL  Final   Special Requests Blood Culture adequate volume  Final   Culture   Final    NO GROWTH 5 DAYS Performed at [facility], [address] [place], [place] [address]    Report Status 09/18/2017 FINAL  Final  MRSA PCR Screening     Status: None   Collection Time: 09/13/17 11:51 PM  Result Value Ref Range Status   MRSA by PCR NEGATIVE NEGATIVE Final    Comment:        The GeneXpert MRSA Assay (FDA approved for NASAL specimens only), is  one component of a comprehensive MRSA colonization surveillance program. It is not intended to diagnose MRSA infection nor to guide or monitor treatment for MRSA infections. Performed at [facility], [address] [place], [place] [address]   Urine Culture     Status: None   Collection Time: 09/14/17  3:04 PM  Result Value Ref Range Status   Specimen Description   Final    URINE, RANDOM Performed at [facility], [address], [place], [place] [address]    Special Requests   Final    Normal Performed at [facility], [address] [place], [place] [address]    Culture   Final    NO GROWTH Performed at [facility], [place] [address], [place], [place] [address]    Report Status 09/15/2017 FINAL  Final    RADIOLOGY:  Dg Chest Port 1 View  Result Date: 09/15/2017 CLINICAL DATA:  Acute onset of cough. EXAM: PORTABLE CHEST 1 VIEW COMPARISON:  Chest radiograph performed 09/13/2017 FINDINGS: A small right pleural effusion is noted. Increased interstitial markings are noted, with hazy right-sided airspace opacity. This may reflect asymmetric pulmonary edema. No pneumothorax is seen The cardiomediastinal silhouette is borderline normal in size. No acute osseous abnormalities are identified. IMPRESSION: Small right pleural effusion noted. Increased interstitial markings, with hazy right-sided airspace opacity. This may reflect asymmetric pulmonary edema. Electronically Signed   By: Garald Balding M.D.   On: 09/15/2017 00:29    EKG:   Orders placed or performed during the hospital encounter of 09/13/17  . ED EKG  . ED EKG  .  EKG 12-Lead  . EKG 12-Lead  . EKG 12-Lead  . EKG 12-Lead  . EKG 12-Lead  . EKG 12-Lead      Management plans discussed with the patient, family and they are in agreement.  CODE STATUS:     Code Status Orders  (From admission, onward)        Start     Ordered   09/15/17 1216  Do not attempt resuscitation (DNR)  Continuous    Question Answer Comment  In the event of cardiac or respiratory ARREST Do not call a "code blue"   In the event of cardiac or respiratory ARREST Do not perform Intubation, CPR, defibrillation or ACLS   In the event of cardiac or respiratory ARREST Use medication by any route, position, wound care, and other measures to relive pain and suffering. May use oxygen, suction and manual treatment of airway obstruction as needed for comfort.   Comments RN may pronounce      09/15/17 1215    Code Status History    Date Active Date Inactive Code Status Order ID Comments User Context   09/13/2017 2321 09/15/2017 1215 Full Code [id number]  Lance Coon, MD Inpatient   01/23/2015 1028 01/25/2015 1552 Full Code [id number]  Dustin Flock, MD ED      TOTAL TIME TAKING CARE OF THIS PATIENT: 43  minutes.   Note: This dictation was prepared with Dragon dictation along with smaller phrase technology. Any transcriptional errors that result from this process are unintentional.   @[facility] @  on 09/18/2017 at 2:13 PM  Between 7am to 6pm - Pager - [phone]  After 6pm go to [email or web address] - password [email or web address] [facility]  Office  [phone]  CC: Primary care physician; Rusty Aus, MD

## 2017-09-18 NOTE — Clinical Social Work Note (Addendum)
CSW spoke to [place] at [facility] [phone] [place], [place], [address] and they can accept patient back today via EMS transport.  CSW updated physician who is ordering palliative to follow patient at [facility].  Patient to be d/c'ed today to [facility] [phone], [place], [address].  Patient and family agreeable to plans will transport via ems RN to call report [phone].  Patient's daughter was at bedside and is aware that patient will be returning back to [facility].  Evette Cristal, MSW, [place]

## 2017-09-18 NOTE — Progress Notes (Signed)
Progress Note  Patient Name: Cynthia Lynch Date of Encounter: 09/18/2017  Primary Cardiologist: New to [facility] this morning minimally conversant,  Telemetry reviewed heart rate typically 95-100  Inpatient Medications    Scheduled Meds: . digoxin  0.0625 mg Oral Daily  . enoxaparin (LOVENOX) injection  30 mg Subcutaneous Q24H  . furosemide  20 mg Oral BID  . insulin aspart  0-5 Units Subcutaneous QHS  . insulin aspart  0-9 Units Subcutaneous TID WC  . latanoprost  1 drop Both Eyes QHS  . metoprolol tartrate  50 mg Oral BID   Continuous Infusions:  PRN Meds: acetaminophen **OR** acetaminophen, levalbuterol, metoprolol tartrate, ondansetron **OR** ondansetron (ZOFRAN) IV   Vital Signs    Vitals:   09/17/17 1632 09/17/17 1957 09/17/17 2230 09/18/17 0353  BP: 123/62 (!) 140/106 (!) 149/89 (!) 129/101  Pulse: (!) 103 (!) 118 80 (!) 104  Resp: 14 18  18   Temp:  (!) 97.5 F (36.4 C)  97.9 F (36.6 C)  TempSrc:  Oral    SpO2:  96%  98%  Weight:      Height:        Intake/Output Summary (Last 24 hours) at 09/18/2017 0725 Last data filed at 09/18/2017 0100 Gross per 24 hour  Intake 360 ml  Output 1100 ml  Net -740 ml   Filed Weights   09/13/17 2123 09/13/17 2341  Weight: 126 lb 6.4 oz (57.3 kg) 127 lb 10.3 oz (57.9 kg)    Telemetry    Atrial fib with rate 95-110- Personally Reviewed  ECG      Physical Exam   GEN: No acute distress.  Sleeping Neck: No JVD Cardiac: irreg irreg, tachy,  no murmurs, rubs, or gallops.  Respiratory: Clear to auscultation bilaterally. GI: Soft, nontender, non-distended  MS: No edema; No deformity. Neuro:  Nonfocal  Psych: Sleeping, minimally conversant  Labs    Chemistry Recent Labs  Lab 09/13/17 1915 09/14/17 0934  NA 141 142  K 3.5 3.6  CL 104 110  CO2 26 22  GLUCOSE 185* 118*  BUN 24* 19  CREATININE 0.91 0.84  CALCIUM 10.2 8.7*  PROT 7.6  --   ALBUMIN 4.1  --   AST 27  --   ALT 17  --    ALKPHOS 69  --   BILITOT 0.8  --   GFRNONAA 51* 56*  GFRAA 59* >60  ANIONGAP 11 10     Hematology Recent Labs  Lab 09/13/17 1915 09/14/17 0934  WBC 11.4* 8.2  RBC 4.98 4.37  HGB 14.9 13.1  HCT 46.0 40.6  MCV 92.5 92.8  MCH 30.0 29.9  MCHC 32.4 32.2  RDW 15.2* 15.1*  PLT 232 183    Cardiac Enzymes Recent Labs  Lab 09/13/17 1915  TROPONINI <0.03   No results for input(s): TROPIPOC in the last 168 hours.   BNPNo results for input(s): BNP, PROBNP in the last 168 hours.   DDimer No results for input(s): DDIMER in the last 168 hours.   Radiology    No results found.  Cardiac Studies     Patient Profile     Cynthia Lynch is a 84 y.o. female who we are asked to see for atrial fibrillation  She was admitted 4/18 with altered mental status lethargy slumped over in her wheelchair.  She met sepsis criteria and was found to have a UTI.  Assessment & Plan    Atrial fib persistent Rate  still mildly elevated but acceptable Will increase metoprolol up to 50 twice daily given better blood pressure this morning Will start very low-dose digoxin daily Multiple thromboembolic risk factors-CHADS VASc score greater 5 --Aspirin is not indicated.  It has recently be removed from the guidelines.  If she is not a candidate for anticoagulation then I would treat her with nothing  Dementia No nursing notes with overnight events  Notes indicating she was full assist   UTI/sepsis on ABX Blood pressure stable   Total encounter time more than 25 minutes  Greater than 50% was spent in counseling and coordination of care with the patient   For questions or updates, please contact [place] Please consult [email or web address] for contact info under Cardiology/STEMI.      Signed, Ida Rogue, MD  09/18/2017, 7:25 AM

## 2017-09-18 NOTE — Progress Notes (Signed)
CCMD notified that pt. Had a 2.5 sec pause, text page sent to Dr. Rockey Situ, pt currently Afib HR 77 on monitor, resting comfortably, with family at bedside, no complaints, will continue to monitor.

## 2017-09-18 NOTE — Plan of Care (Signed)
  Problem: Clinical Measurements: Goal: Diagnostic test results will improve Outcome: Progressing   Problem: Clinical Measurements: Goal: Signs and symptoms of infection will decrease Outcome: Progressing   Problem: Clinical Measurements: Goal: Ability to maintain clinical measurements within normal limits will improve Outcome: Adequate for Discharge   Problem: Clinical Measurements: Goal: Diagnostic test results will improve Outcome: Adequate for Discharge

## 2017-09-18 NOTE — Consult Note (Addendum)
Consultation Note Date: 09/18/2017   Patient Name: Cynthia Lynch  DOB: [birth year]  MRN: [id number]  Age / Sex: 84 y.o., female  PCP: Rusty Aus, MD Referring Physician: [name], MD  Reason for Consultation: Establishing goals of care  HPI/Patient Profile: Cynthia Lynch  is a 84 y.o. female who presented with lethargy, altered mental status.  Patient was at the facility where she resides at dinnertime and was found slumped over and not really responding much. She was admitted for a UTI.    Clinical Assessment and Goals of Care: Patient is resting in bed. Her daughter is at bedside.  Ms. Ammon raised 5 children with her husband [place] a farm. She states she is the POA, but that the 3 sisters make decisions together.   Her daughter states her mother has had dementia for 10 years and has lived in an assisted living facility for the last 6-7years. She was using a cane when admitted to the facility, and quickly moved to a walker. She has been using a walker since. She enjoys sitting in the straight back chair at her facility after breakfast. She sits on the couch and watches t.v. after lunch. She feeds herself with a fork. She takes a long time to eat as she chews thoroughly. She uses [facility].   Since being in the hospital her family has had to help her eat. Her daughter states her older sister used to be the POA but had to renounce due to her own health problems. She has a copy of the living will with her sister's renouncement. She states she is now the POA but does not have the new form. She tells me she does not agree with everything on the form her older sister decided.   Patient is discharging today.  New living will and POA from provided to daughter. Recommended family meeting if she is the new POA and does not agree with prior living will.  Recommend outpatient palliative to follow.       SUMMARY  OF RECOMMENDATIONS    Recommend outpatient palliative to follow.    Code Status/Advance Care Planning:  DNR    Symptom Management:   Per primary team.  Palliative Prophylaxis:   Eye Care and Oral Care   Prognosis:   Steady status prior to this hospitalization. If poor oral intake and poor activity level after discharge, prognosis will be poor with possible change to hospice.   Discharge Planning: Facility with palliative.      Primary Diagnoses: Present on Admission: . Severe sepsis ([place]) . UTI (urinary tract infection) . Acute encephalopathy . HTN (hypertension) . HLD (hyperlipidemia)   I have reviewed the medical record, interviewed the patient and family, and examined the patient. The following aspects are pertinent.  Past Medical History:  Diagnosis Date  . Alzheimer's dementia   . Cancer (HCC)    breast, ovarian  . Chronic kidney disease (CKD), stage III (moderate) (HCC)   . Diabetes mellitus without complication ([place])   . Hematoma  right forehead  . Hyperlipidemia   . Hypertension   . Subarchnoid hemorrhage-brief coma    Social History   Socioeconomic History  . Marital status: Widowed    Spouse name: Not on file  . Number of children: Not on file  . Years of education: Not on file  . Highest education level: Not on file  Occupational History  . Not on file  Social Needs  . Financial resource strain: Not on file  . Food insecurity:    Worry: Not on file    Inability: Not on file  . Transportation needs:    Medical: Not on file    Non-medical: Not on file  Tobacco Use  . Smoking status: Never Smoker  . Smokeless tobacco: Never Used  Substance and Sexual Activity  . Alcohol use: No  . Drug use: No  . Sexual activity: Not Currently  Lifestyle  . Physical activity:    Days per week: Not on file    Minutes per session: Not on file  . Stress: Not on file  Relationships  . Social connections:    Talks on phone: Not on file    Gets  together: Not on file    Attends religious service: Not on file    Active member of club or organization: Not on file    Attends meetings of clubs or organizations: Not on file    Relationship status: Not on file  Other Topics Concern  . Not on file  Social History Narrative  . Not on file   Family History  Problem Relation Age of Onset  . Hypertension Unknown    Scheduled Meds: . digoxin  0.0625 mg Oral Daily  . enoxaparin (LOVENOX) injection  30 mg Subcutaneous Q24H  . feeding supplement (ENSURE ENLIVE)  237 mL Oral BID BM  . furosemide  20 mg Oral BID  . insulin aspart  0-5 Units Subcutaneous QHS  . insulin aspart  0-9 Units Subcutaneous TID WC  . latanoprost  1 drop Both Eyes QHS  . metoprolol tartrate  50 mg Oral BID   Continuous Infusions: PRN Meds:.acetaminophen **OR** acetaminophen, levalbuterol, metoprolol tartrate, ondansetron **OR** ondansetron (ZOFRAN) IV Medications Prior to Admission:  Prior to Admission medications   Medication Sig Start Date End Date Taking? Authorizing Provider  acetaminophen (TYLENOL) 325 MG tablet Take 650 mg by mouth 2 (two) times daily.   Yes [provider]  amLODipine (NORVASC) 2.5 MG tablet Take 2.5 mg by mouth daily.   Yes [provider]  Calcium Citrate-Vitamin D (CITRACAL MAXIMUM) 315-250 MG-UNIT TABS Take 1 tablet by mouth 2 (two) times daily.   Yes [provider]  conjugated estrogens (PREMARIN) vaginal cream Place 1 Applicatorful vaginally every Monday, Wednesday, and Friday. Apply pea sized amount to uretheral openings at bedtime on Monday, Wednesday, and Friday.   Yes [provider]  Cranberry 250 MG CAPS Take 250 mg by mouth 2 (two) times daily.   Yes [provider]  dextromethorphan-guaiFENesin (MUCINEX DM) 30-600 MG 12hr tablet Take 1 tablet by mouth 2 (two) times daily as needed for cough.   Yes [provider]  furosemide (LASIX) 20 MG tablet Take 1 tablet by mouth daily  as needed for fluid or edema.  10/12/16  Yes [provider]  glimepiride (AMARYL) 2 MG tablet Take 2 mg by mouth 2 (two) times daily.   Yes [provider]  guaiFENesin (ROBITUSSIN) 100 MG/5ML liquid Take 200 mg by mouth every 6 (six)  hours as needed for cough. Take up to 48 hours.   Yes [provider]  hydrocortisone cream 1 % Apply 1 application topically 2 (two) times daily as needed (dry skin).    Yes [provider]  latanoprost (XALATAN) 0.005 % ophthalmic solution Place 1 drop into both eyes at bedtime.    Yes [provider]  loratadine (CLARITIN) 10 MG tablet Take 1 tablet by mouth every morning.   Yes [provider]  senna (SENOKOT) 8.6 MG tablet Take 1 tablet by mouth daily.   Yes [provider]  carvedilol (COREG) 6.25 MG tablet Take 1 tablet (6.25 mg total) by mouth 2 (two) times daily with a meal. Patient not taking: Reported on 05/27/2016 01/25/15   Rusty Aus, MD   No Known Allergies Review of Systems  All other systems reviewed and are negative.   Physical Exam  Constitutional: No distress.  Pulmonary/Chest: Effort normal.  Neurological: She is alert.  Dementia at baseline.   Skin: Skin is warm and dry.    Vital Signs: BP 138/78 (BP Location: Right Arm)   Pulse (!) 101   Temp 98.3 F (36.8 C) (Oral)   Resp 18   Ht 5\' 3"  (1.6 m)   Wt 57.9 kg (127 lb 10.3 oz)   SpO2 97%   BMI 22.61 kg/m  Pain Scale: 0-10   Pain Score: 0-No pain   SpO2: SpO2: 97 % O2 Device:SpO2: 97 % O2 Flow Rate: .O2 Flow Rate (L/min): 2 L/min  IO: Intake/output summary:   Intake/Output Summary (Last 24 hours) at 09/18/2017 1331 Last data filed at 09/18/2017 0100 Gross per 24 hour  Intake -  Output 900 ml  Net -900 ml    LBM: Last BM Date: (unknown) Baseline Weight: Weight: 57.3 kg (126 lb 6.4 oz) Most recent weight: Weight: 57.9 kg (127 lb 10.3 oz)     Palliative Assessment/Data:    Flowsheet Rows     Most  Recent Value  Intake Tab  Referral Department  Hospitalist  Unit at Time of Referral  Med/Surg Unit  Palliative Care Primary Diagnosis  Cancer  Date Notified  09/17/17  Palliative Care Type  New Palliative care  Reason for referral  Counsel Regarding Hospice  Date of Admission  09/13/17  # of days IP prior to Palliative referral  4  Clinical Assessment  Psychosocial & Spiritual Assessment  Palliative Care Outcomes      Time In: 11:50 Time Out: 1:00 Time Total: 70 min Greater than 50%  of this time was spent counseling and coordinating care related to the above assessment and plan.  Signed by: Asencion Gowda, NP   Please contact Palliative Medicine Team phone at [phone] for questions and concerns.  For individual provider: See Shea Evans

## 2017-09-18 NOTE — Progress Notes (Signed)
New referral for out patient Palliative to follow at [facility] received from [place]. Plan is for discharge today. Patient information faxed to referral. Flo Shanks RN, BSN, [facility] [place], hospital liaison [phone]

## 2017-09-18 NOTE — NC FL2 (Addendum)
[place] LEVEL OF CARE SCREENING TOOL     IDENTIFICATION  Patient Name: Cynthia Lynch Birthdate: Apr 07, 1917 Sex: female Admission Date (Current Location): 09/13/2017  [facility] and [place] Number:  Engineering geologist and Address:  [facility], [address] [place], [place] [address]      Provider Number: [id number]  Attending Physician Name and Address:  [name], MD  Relative Name and Phone Number:  Newton,[phone] and Braxton,Wanda Daughter [phone]     Current Level of Care: Hospital Recommended Level of Care: [place] Prior Approval Number:    Date Approved/Denied:   PASRR Number:    Discharge Plan: ALF    Current Diagnoses: Patient Active Problem List   Diagnosis Date Noted  . Severe sepsis ([place]) 09/13/2017  . UTI (urinary tract infection) 09/13/2017  . Acute encephalopathy 09/13/2017  . HTN (hypertension) 09/13/2017  . HLD (hyperlipidemia) 09/13/2017  . Diabetes ([place]) 09/13/2017  . Syncope and collapse 01/23/2015    Orientation RESPIRATION BLADDER Height & Weight     Self  Normal Incontinent Weight: 127 lb 10.3 oz (57.9 kg) Height:  5\' 3"  (160 cm)  BEHAVIORAL SYMPTOMS/MOOD NEUROLOGICAL BOWEL NUTRITION STATUS      Incontinent Diet(Carb Modified)  AMBULATORY STATUS COMMUNICATION OF NEEDS Skin   Extensive Assist Verbally Normal                       Personal Care Assistance Level of Assistance  Bathing, Feeding, Dressing Bathing Assistance: Maximum assistance Feeding assistance: Limited assistance Dressing Assistance: Maximum assistance     Functional Limitations Info  Sight, Hearing, Speech Sight Info: Adequate Hearing Info: Adequate Speech Info: Adequate    SPECIAL CARE FACTORS FREQUENCY                       Contractures      Additional Factors Info  Code Status, Allergies, Insulin Sliding Scale Code Status Info: DNR Allergies Info: NKA    Insulin Sliding Scale Info: insulin aspart (novoLOG) injection 0-5 Units 3x a day with meals       Current Medications (09/18/2017):  This is the current hospital active medication list Current Facility-Administered Medications  Medication Dose Route Frequency Provider Last Rate Last Dose  . acetaminophen (TYLENOL) tablet 650 mg  650 mg Oral Q6H PRN Lance Coon, MD   650 mg at 09/17/17 1730   Or  . acetaminophen (TYLENOL) suppository 650 mg  650 mg Rectal Q6H PRN Lance Coon, MD      . digoxin (LANOXIN) tablet 0.0625 mg  0.0625 mg Oral Daily Minna Merritts, MD   0.0625 mg at 09/18/17 0919  . enoxaparin (LOVENOX) injection 30 mg  30 mg Subcutaneous Q24H Lance Coon, MD   30 mg at 09/17/17 2227  . feeding supplement (ENSURE ENLIVE) (ENSURE ENLIVE) liquid 237 mL  237 mL Oral BID BM Gouru, Aruna, MD   237 mL at 09/18/17 1108  . furosemide (LASIX) tablet 20 mg  20 mg Oral BID Gouru, Aruna, MD   20 mg at 09/18/17 0919  . insulin aspart (novoLOG) injection 0-5 Units  0-5 Units Subcutaneous QHS Lance Coon, MD      . insulin aspart (novoLOG) injection 0-9 Units  0-9 Units Subcutaneous TID WC Lance Coon, MD   1 Units at 09/18/17 1221  . latanoprost (XALATAN) 0.005 % ophthalmic solution 1 drop  1 drop Both Eyes QHS Lance Coon, MD   1 drop  at 09/17/17 2231  . levalbuterol (XOPENEX) nebulizer solution 0.63 mg  0.63 mg Nebulization Q6H PRN Lance Coon, MD   0.63 mg at 09/15/17 0020  . metoprolol tartrate (LOPRESSOR) injection 5 mg  5 mg Intravenous Q4H PRN Gouru, Aruna, MD      . metoprolol tartrate (LOPRESSOR) tablet 50 mg  50 mg Oral BID Minna Merritts, MD   50 mg at 09/18/17 0919  . ondansetron (ZOFRAN) tablet 4 mg  4 mg Oral Q6H PRN Lance Coon, MD       Or  . ondansetron [facility]) injection 4 mg  4 mg Intravenous Q6H PRN Lance Coon, MD         Discharge Medications: STOP taking these medications   amLODipine 2.5 MG tablet Commonly known as:  NORVASC   carvedilol  6.25 MG tablet Commonly known as:  COREG   dextromethorphan-guaiFENesin 30-600 MG 12hr tablet Commonly known as:  MUCINEX DM     TAKE these medications   acetaminophen 325 MG tablet Commonly known as:  TYLENOL Take 650 mg by mouth 2 (two) times daily.   CITRACAL MAXIMUM 315-250 MG-UNIT Tabs Generic drug:  Calcium Citrate-Vitamin D Take 1 tablet by mouth 2 (two) times daily.   conjugated estrogens vaginal cream Commonly known as:  PREMARIN Place 1 Applicatorful vaginally every Monday, Wednesday, and Friday. Apply pea sized amount to uretheral openings at bedtime on Monday, Wednesday, and Friday.   Cranberry 250 MG Caps Take 250 mg by mouth 2 (two) times daily.   Digoxin 62.5 MCG Tabs Take 0.0625 mg by mouth daily. Start taking on:  09/19/2017   feeding supplement (ENSURE ENLIVE) Liqd Take 237 mLs by mouth 2 (two) times daily between meals.   furosemide 20 MG tablet Commonly known as:  LASIX Take 1 tablet (20 mg total) by mouth 2 (two) times daily. What changed:    when to take this  reasons to take this   glimepiride 2 MG tablet Commonly known as:  AMARYL Take 2 mg by mouth 2 (two) times daily.   guaiFENesin 100 MG/5ML liquid Commonly known as:  ROBITUSSIN Take 200 mg by mouth every 6 (six) hours as needed for cough. Take up to 48 hours.   hydrocortisone cream 1 % Apply 1 application topically 2 (two) times daily as needed (dry skin).   latanoprost 0.005 % ophthalmic solution Commonly known as:  XALATAN Place 1 drop into both eyes at bedtime.   loratadine 10 MG tablet Commonly known as:  CLARITIN Take 1 tablet by mouth every morning.   metoprolol tartrate 50 MG tablet Commonly known as:  LOPRESSOR Take 1 tablet (50 mg total) by mouth 2 (two) times daily.   SENOKOT 8.6 MG tablet Generic drug:  senna Take 1 tablet by mouth daily.      Relevant Imaging Results:  Relevant Lab Results:   Additional Information SSN [id number]   Palliative to follow at [facility].  Mialee Weyman, [place], LCSWA

## 2017-09-18 NOTE — Clinical Social Work Note (Signed)
Clinical Social Work Assessment  Patient Details  Name: Cynthia Lynch MRN: [id number] Date of Birth: Jun 12, 1916  Date of referral:  09-17-17             Reason for consult:  Facility Placement                Permission sought to share information with:  Facility Sport and exercise psychologist, Family Supports Permission granted to share information::  Yes, Verbal Permission Granted  Name::     Newton,Louise Daughter [phone] or Braxton,Wanda Daughter [phone]   Agency::  [facility]  Relationship::     Contact Information:     Housing/Transportation Living arrangements for the past 2 months:  [place] of Information:  Medical Team Patient Interpreter Needed:  None Criminal Activity/Legal Involvement Pertinent to Current Situation/Hospitalization:  No - Comment as needed Significant Relationships:  Adult Children Lives with:  Facility Resident Do you feel safe going back to the place where you live?  Yes Need for family participation in patient care:  Yes (Comment)  Care giving concerns:  Patient is from [facility], and they can accept her back with palliative or hospice to follow.     Social Worker assessment / plan:  Patient is a 84 year old female who is alert and oriented x1.  Patient has dementia, assessment completed by speaking with [facility] administrator.  Patient has been at ALF for several years, they would like patient to return back, even though she needs almost total assistance.  Palliative will be meeting with patient and her family to discuss plans and decide if patient will need hospice services at [facility], or if she will need palliative to follow.  CSW was given permission to send clinical information to [facility].  Employment status:  Retired Forensic scientist:  Medicare PT Recommendations:  Not assessed at this time Information / Referral to community resources:     Patient/Family's Response to care:   Patient's family is agreeable to having her return back to ALF.  Patient/Family's Understanding of and Emotional Response to Diagnosis, Current Treatment, and Prognosis:  Patient's family are releived that patient will be returning back to ALF.  Emotional Assessment Appearance:  Appears stated age Attitude/Demeanor/Rapport:    Affect (typically observed):  Appropriate, Calm Orientation:  Oriented to Self Alcohol / Substance use:  Not Applicable Psych involvement (Current and /or in the community):  No (Comment)  Discharge Needs  Concerns to be addressed:  Care Coordination Readmission within the last 30 days:  No Current discharge risk:  Cognitively Impaired Barriers to Discharge:  Continued Medical Work up   Anell Barr 09/18/2017, 12:16 PM

## 2017-09-18 NOTE — Discharge Instructions (Signed)
Follow with primary care physician at the facility in 4 to 5 days Follow-up wi palliative care Follow-up with cardiology in a week

## 2017-10-27 DEATH — deceased

## 2018-10-18 IMAGING — DX DG CHEST 1V PORT
1 series · 1 of 1 positions shown · non-contrast
Comparison: A 12/16/2014 and prior chest radiograph

CLINICAL DATA: Weakness and atrial fibrillation.

EXAM:
PORTABLE CHEST 1 VIEW

[chest ap]
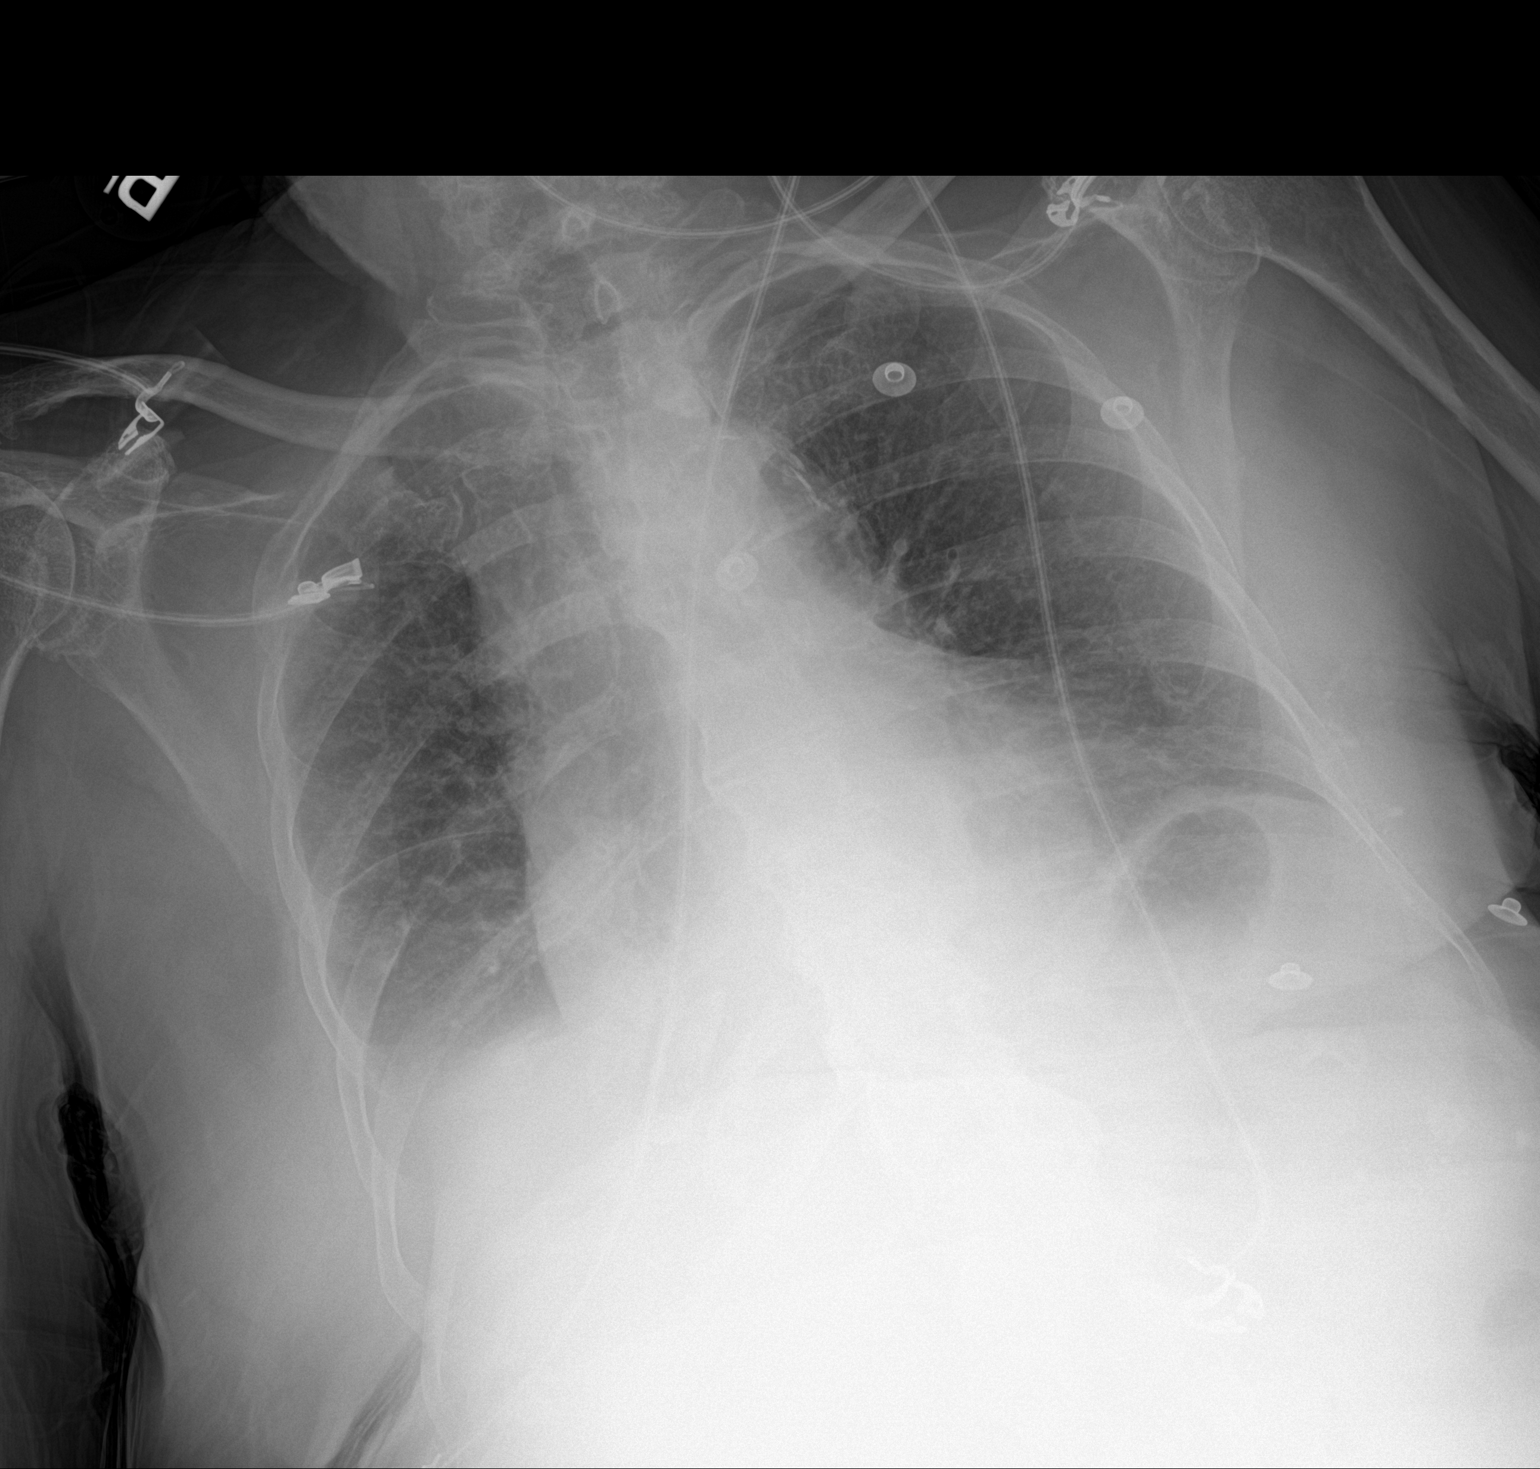

[1 of 1 positions shown; findings below may reference images not displayed]

FINDINGS: Cardiomegaly and pulmonary vascular congestion noted.

Mild bibasilar opacities are noted, probably atelectasis.

A probable small RIGHT pleural effusion is noted.

There is no evidence of pneumothorax.

No acute bony abnormalities are present.
IMPRESSION: 1. Cardiomegaly with pulmonary vascular congestion.
2. Probable small RIGHT pleural effusion.
3. Mild bibasilar opacities-favor atelectasis.

## 2018-10-18 IMAGING — CT CT HEAD W/O CM
3 series · 15 of 46 positions shown, 18 images · non-contrast
Comparison: Head CT dated 05/27/2016

CLINICAL DATA: Unresponsive, RIGHT-sided facial droop.

EXAM:
CT HEAD WITHOUT CONTRAST
TECHNIQUE: Contiguous axial images were obtained from the base of the skull
through the vertex without intravenous contrast.

[Series 2: head wo · axial · 0.40mm/px · z∈[+420,+540]mm · 9 of 29 slices shown, 12 images]
[im 3/29  brain]
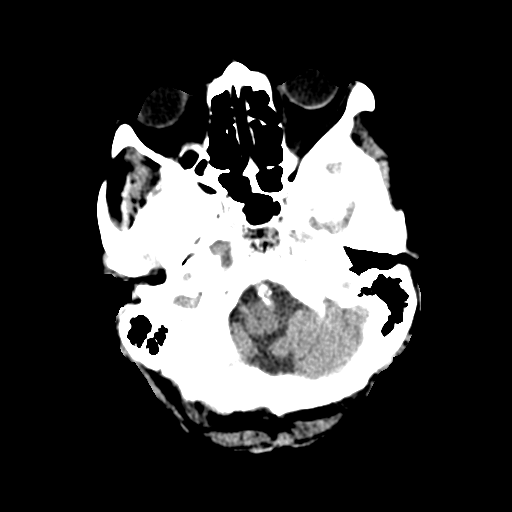
[im 3/29  bone]
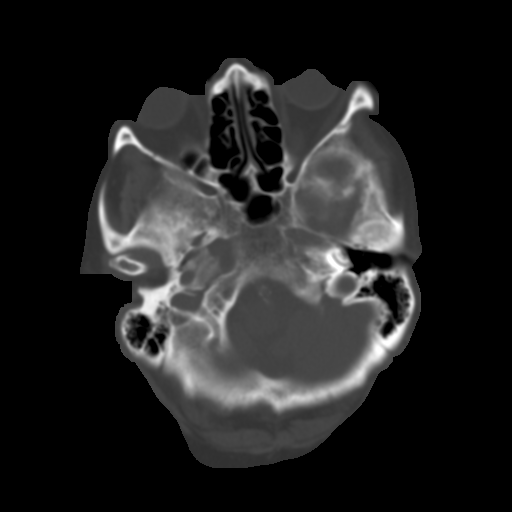
[im 6/29  brain]
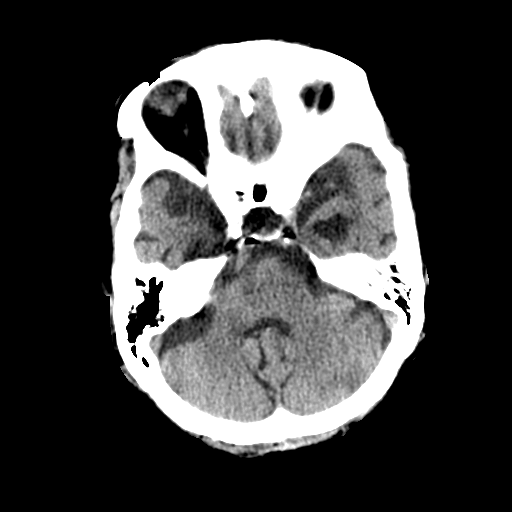
[im 9/29  brain]
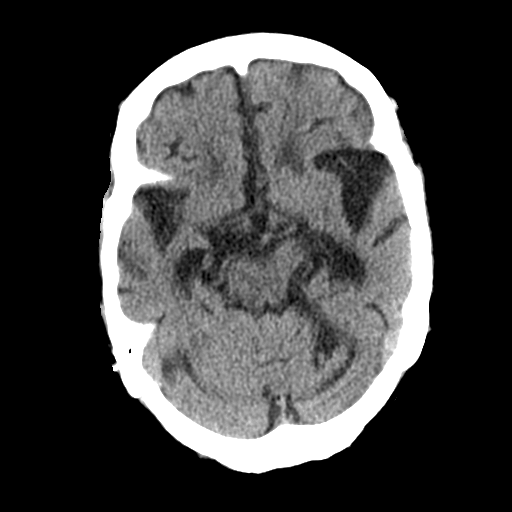
[im 12/29  brain]
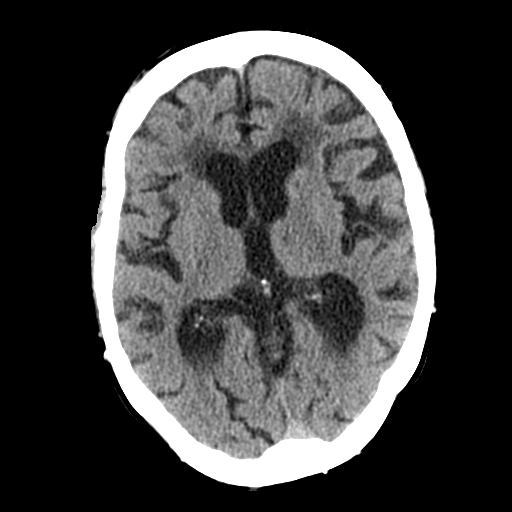
[im 15/29  brain]
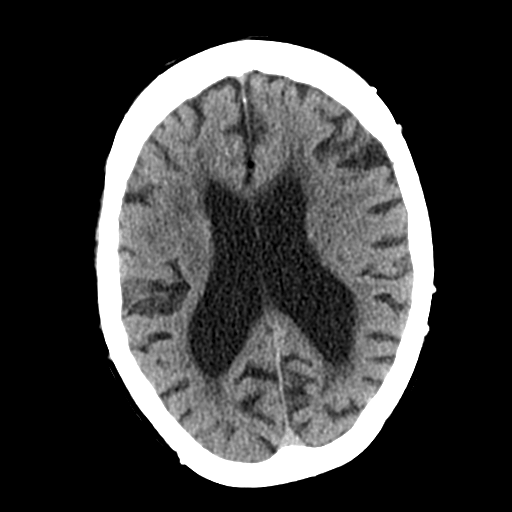
[im 15/29  bone]
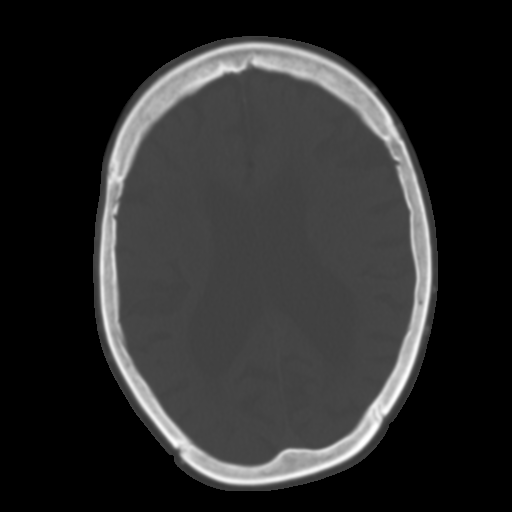
[im 18/29  brain]
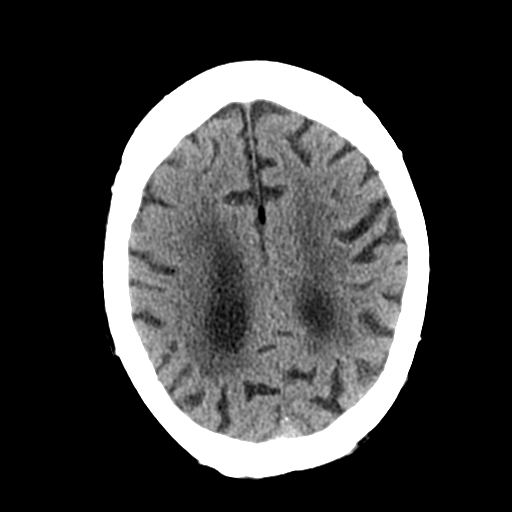
[im 21/29  brain]
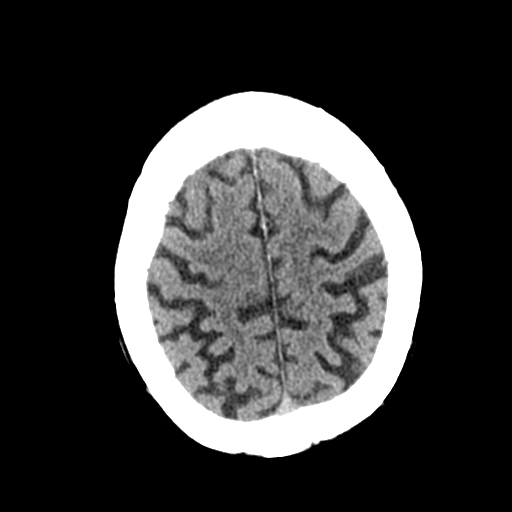
[im 24/29  brain]
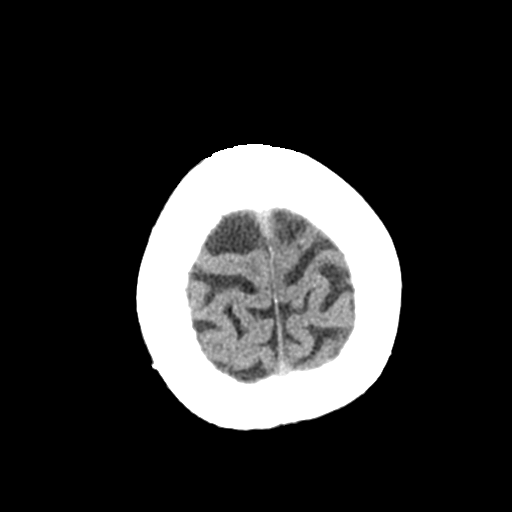
[im 27/29  brain]
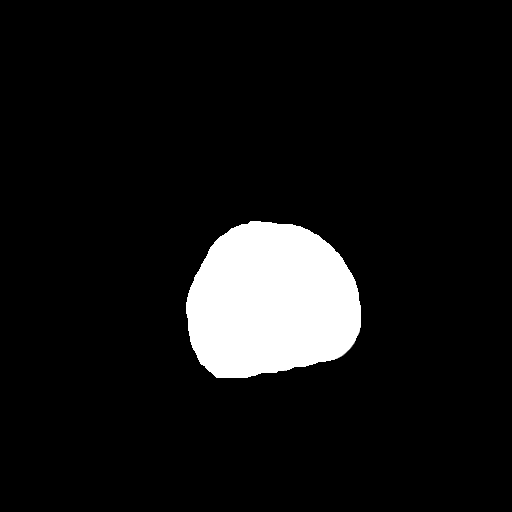
[im 27/29  bone]
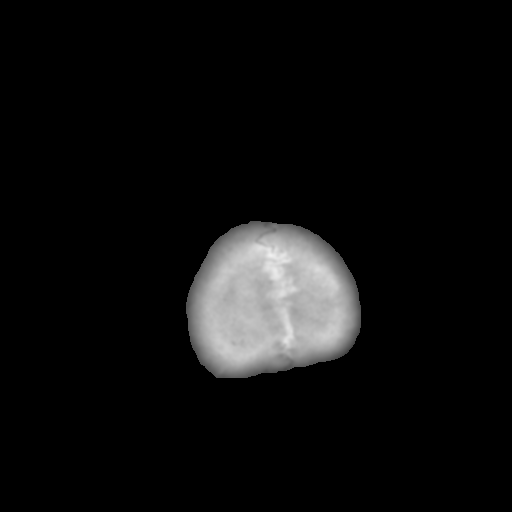

[Series 4: coronal soft tissue · coronal · 0.29mm/px · 3 of 63 slices shown]
[im 21/63  brain]
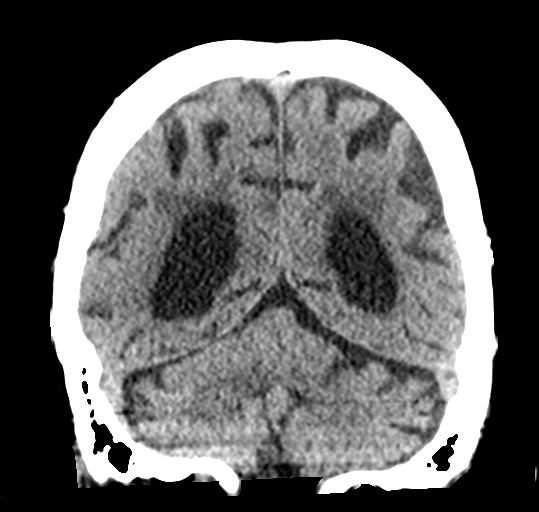
[im 28/63  brain]
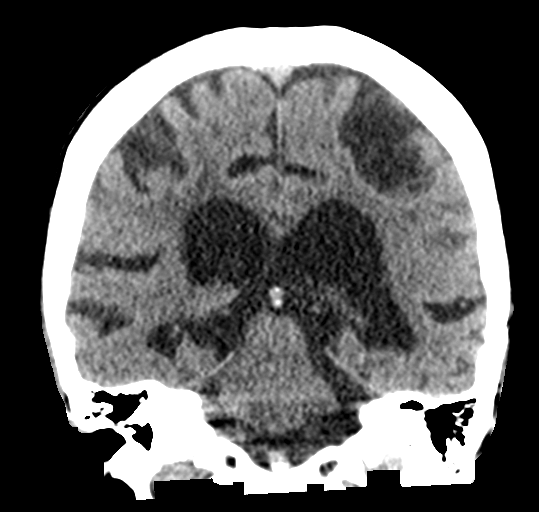
[im 35/63  brain]
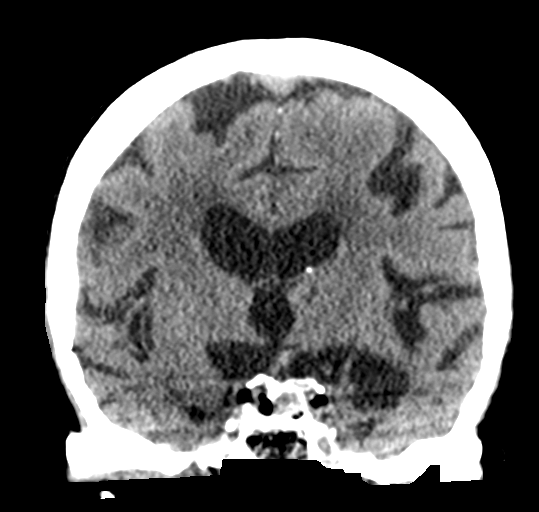

[Series 5: sagittal soft tissue · sagittal · 0.30mm/px · 3 of 47 slices shown]
[im 16/47  brain]
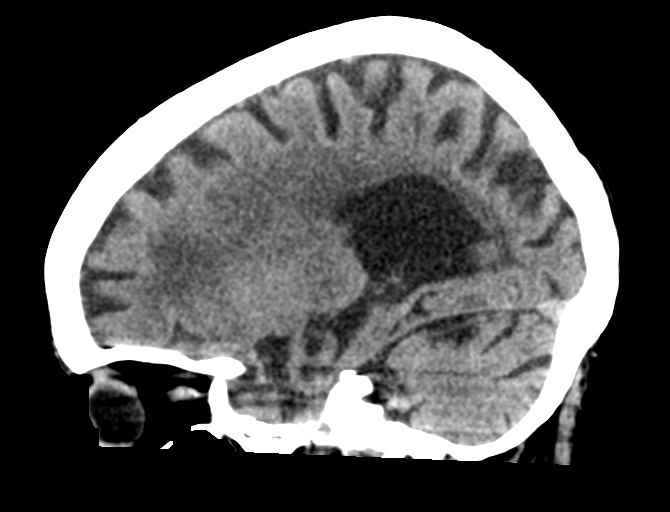
[im 24/47  brain]
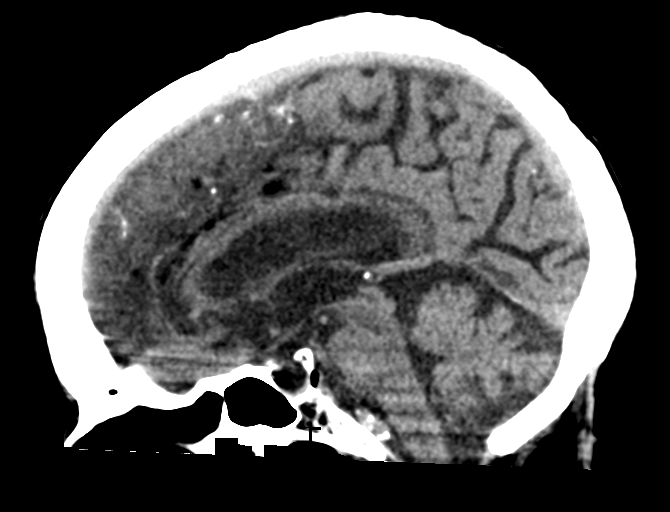
[im 31/47  brain]
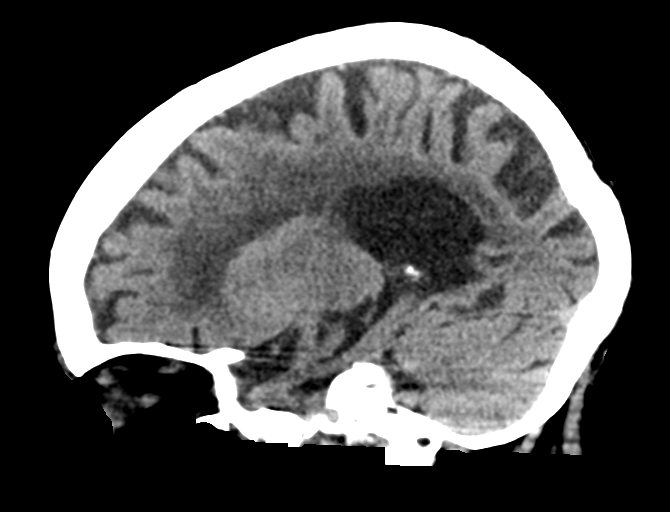

[15 of 46 positions shown; findings below may reference images not displayed]

FINDINGS: Brain: Age related parenchymal atrophy with commensurate dilatation
of the ventricles and sulci. Chronic small vessel ischemic changes
again noted within the bilateral periventricular and subcortical
white matter regions.

No mass, hemorrhage, edema or other evidence of acute parenchymal
abnormality. No extra-axial hemorrhage.

Vascular: There are chronic calcified atherosclerotic changes of the
large vessels at the skull base. No unexpected hyperdense vessel.

Skull: Normal. Negative for fracture or focal lesion.

Sinuses/Orbits: No acute finding.

Other: None.
IMPRESSION: 1. No acute findings.  No intracranial mass, hemorrhage or edema.
2. Atrophy and chronic small vessel ischemic changes in the white
matter.
